# Patient Record
Sex: Male | Born: 1945
Health system: Southern US, Community
[De-identification: ages and names within clinical notes are randomized; demographics above are authoritative.]

## PROBLEM LIST (undated history)

## (undated) DIAGNOSIS — I10 Essential (primary) hypertension: Secondary | ICD-10-CM

## (undated) DIAGNOSIS — K219 Gastro-esophageal reflux disease without esophagitis: Secondary | ICD-10-CM

---
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
## 1998-03-27 HISTORY — PX: PROSTATE BIOPSY: SHX241

## 2011-01-22 LAB — HM HEPATITIS C SCREENING LAB: HM HEPATITIS C SCREENING: NEGATIVE

## 2011-10-21 DIAGNOSIS — R972 Elevated prostate specific antigen [PSA]: Secondary | ICD-10-CM | POA: Insufficient documentation

## 2014-08-21 DIAGNOSIS — N400 Enlarged prostate without lower urinary tract symptoms: Secondary | ICD-10-CM | POA: Insufficient documentation

## 2014-08-21 DIAGNOSIS — N529 Male erectile dysfunction, unspecified: Secondary | ICD-10-CM | POA: Insufficient documentation

## 2014-08-21 DIAGNOSIS — R946 Abnormal results of thyroid function studies: Secondary | ICD-10-CM | POA: Insufficient documentation

## 2014-08-21 DIAGNOSIS — Z205 Contact with and (suspected) exposure to viral hepatitis: Secondary | ICD-10-CM | POA: Insufficient documentation

## 2014-08-21 DIAGNOSIS — I10 Essential (primary) hypertension: Secondary | ICD-10-CM | POA: Insufficient documentation

## 2014-08-21 DIAGNOSIS — G479 Sleep disorder, unspecified: Secondary | ICD-10-CM | POA: Insufficient documentation

## 2014-08-21 DIAGNOSIS — F419 Anxiety disorder, unspecified: Secondary | ICD-10-CM | POA: Insufficient documentation

## 2014-08-21 DIAGNOSIS — E7800 Pure hypercholesterolemia, unspecified: Secondary | ICD-10-CM | POA: Insufficient documentation

## 2014-08-21 DIAGNOSIS — G4733 Obstructive sleep apnea (adult) (pediatric): Secondary | ICD-10-CM | POA: Insufficient documentation

## 2014-11-16 ENCOUNTER — Ambulatory Visit (INDEPENDENT_AMBULATORY_CARE_PROVIDER_SITE_OTHER): Payer: PPO | Admitting: Family Medicine

## 2014-11-16 ENCOUNTER — Encounter: Payer: Self-pay | Admitting: Family Medicine

## 2014-11-16 VITALS — BP 130/60 | HR 60 | Temp 97.8°F | Resp 16 | Ht 68.0 in | Wt 171.2 lb

## 2014-11-16 DIAGNOSIS — Z23 Encounter for immunization: Secondary | ICD-10-CM

## 2014-11-16 DIAGNOSIS — I10 Essential (primary) hypertension: Secondary | ICD-10-CM | POA: Diagnosis not present

## 2014-11-16 DIAGNOSIS — E7800 Pure hypercholesterolemia, unspecified: Secondary | ICD-10-CM

## 2014-11-16 DIAGNOSIS — M545 Low back pain, unspecified: Secondary | ICD-10-CM | POA: Insufficient documentation

## 2014-11-16 DIAGNOSIS — F419 Anxiety disorder, unspecified: Secondary | ICD-10-CM

## 2014-11-16 MED ORDER — FLURBIPROFEN 100 MG PO TABS: 100.0000 mg | ORAL_TABLET | Freq: Two times a day (BID) | ORAL | Status: DC

## 2014-11-16 NOTE — Progress Notes (Signed)
Patient: Michael [name] Male    DOB: October 20, 1945   69 y.o.   MRN: [id number] Visit Date: 11/16/2014  Today's Provider: Vernie Murders, PA   Chief Complaint  Patient presents with  . Hyperlipidemia  . Anxiety  . Hypertension   Subjective:    Anxiety Presents for follow-up visit. Onset was 6 to 12 months ago. The problem has been resolved. Patient reports no chest pain, depressed mood, excessive worry, insomnia, irritability, nervous/anxious behavior, palpitations, panic, shortness of breath or suicidal ideas. Nothing aggravates the symptoms. The quality of sleep is good. Nighttime awakenings: one to two.    Hyperlipidemia The current episode started more than 1 year ago. The problem is controlled. Factors aggravating his hyperlipidemia include fatty foods. Pertinent negatives include no chest pain, leg pain or shortness of breath. The current treatment provides moderate improvement of lipids.  Hypertension This is a chronic problem. The current episode started more than 1 year ago. Associated symptoms include anxiety. Pertinent negatives include no blurred vision, chest pain, headaches, palpitations, peripheral edema or shortness of breath. Compliance problems include diet and exercise.   Patient has been taking his blood pressures 140/71 10/21/14, 138/78 08/29, 141/76 08/31 today blood pressure at 3:00 am was 134/70.    Patient Active Problem List   Diagnosis Date Noted  . Low back ache 11/16/2014  . Anxiety 08/21/2014  . Abnormal finding on thyroid function test 08/21/2014  . Benign fibroma of prostate 08/21/2014  . ED (erectile dysfunction) of organic origin 08/21/2014  . Exposure to viral hepatitis 08/21/2014  . Hypercholesteremia 08/21/2014  . Benign hypertension 08/21/2014  . Severe obstructive sleep apnea 08/21/2014  . Sleep disturbance 08/21/2014  . Abnormal prostate specific antigen 10/21/2011  . Benign prostatic hyperplasia with urinary obstruction 10/21/2011     Past Surgical History  Procedure Laterality Date  . Prostate biopsy  03/1998    NEGATIVE  . Amputation finger Left 1965    5TH DIGIT, INDUSTRIAL ACCIDENT   Family History  Problem Relation Age of Onset  . Emphysema Mother   . Diabetes Maternal Uncle     Allergies  Allergen Reactions  . Penicillins   . Sulfa Antibiotics   . Sulfamethoxazole-Trimethoprim    Previous Medications   ASPIRIN 81 MG TABLET    Take 1 tablet by mouth daily.   ATORVASTATIN (LIPITOR) 40 MG TABLET    Take 1 tablet by mouth at bedtime.   DUTASTERIDE (AVODART) 0.5 MG CAPSULE    Take 1 capsule by mouth 3 (three) times a week.   FLURBIPROFEN (ANSAID) 100 MG TABLET    Take 1 tablet by mouth 2 (two) times daily.   LOSARTAN-HYDROCHLOROTHIAZIDE (HYZAAR) 50-12.5 MG PER TABLET    Take 1 tablet by mouth daily.   MELATONIN (RA MELATONIN) 10 MG TABS    Take by mouth.   OMEGA-3 1000 MG CAPS    Take by mouth.   SILDENAFIL (REVATIO) 20 MG TABLET    Take 1 tablet by mouth daily.   TRAZODONE (DESYREL) 100 MG TABLET    Take 1 tablet by mouth daily.    Review of Systems  Constitutional: Negative.  Negative for irritability.  HENT: Negative.   Eyes: Negative.  Negative for blurred vision.  Respiratory: Negative.  Negative for shortness of breath.   Cardiovascular: Negative.  Negative for chest pain and palpitations.  Gastrointestinal: Negative.   Endocrine: Negative.   Genitourinary: Negative.   Musculoskeletal: Negative.   Skin: Negative.   Allergic/Immunologic:  Negative.   Neurological: Negative.  Negative for headaches.  Hematological: Negative.   Psychiatric/Behavioral: Negative.  Negative for suicidal ideas. The patient is not nervous/anxious and does not have insomnia.     Social History  Substance Use Topics  . Smoking status: Never Smoker   . Smokeless tobacco: Not on file  . Alcohol Use: No   Objective:   BP 130/60 mmHg  Pulse 60  Temp(Src) 97.8 F (36.6 C) (Oral)  Resp 16  Wt 171 lb 3.2 oz  (77.656 kg)  SpO2 96% Body mass index is 27.65 kg/(m^2).  Physical Exam  Constitutional: He is oriented to person, place, and time. He appears well-developed and well-nourished. No distress.  HENT:  Head: Normocephalic and atraumatic.  Right Ear: Hearing normal.  Left Ear: Hearing normal.  Nose: Nose normal.  Mouth/Throat: Oropharynx is clear and moist.  Eyes: Conjunctivae, EOM and lids are normal. Right eye exhibits no discharge. Left eye exhibits no discharge. No scleral icterus.  Neck: Neck supple.  Cardiovascular: Normal rate and regular rhythm.   Pulmonary/Chest: Effort normal and breath sounds normal. No respiratory distress.  Abdominal: Bowel sounds are normal.  Musculoskeletal: Normal range of motion.  Neurological: He is alert and oriented to person, place, and time.  Skin: Skin is intact. No lesion and no rash noted.  Psychiatric: He has a normal mood and affect. His speech is normal and behavior is normal. Thought content normal.      Assessment & Plan:     1. Benign hypertension Well controlled. Tolerating Hyzaar and low fat diet. Recheck routine labs and return to the clinic prn. - CBC with Differential/Platelet  2. Anxiety Tolerating Trazodone 100 mg qd and adding Melatonin 10 mg q hs prn. Refill Trazodone and recheck prn.  3. Hypercholesteremia Stable. Tolerating Atorvastatin and Omega- 3 Fish oil.  Continue present dosage and recheck blood chemistry with lipid panel. Follow up pending reports.  - Comprehensive metabolic panel - Lipid panel  4. Midline low back pain without sciatica Intermittent aching discomfort. May continue Ansaid 100 mg BID prn flare. Good ROM today. May apply moist heat prn.  5. Needs flu shot - Flu vaccine HIGH DOSE PF      Vernie Murders, PA  [facility]

## 2014-11-17 ENCOUNTER — Telehealth: Payer: Self-pay | Admitting: Family Medicine

## 2014-11-17 LAB — COMPREHENSIVE METABOLIC PANEL
A/G RATIO: 2 (ref 1.1–2.5)
ALK PHOS: 63 IU/L (ref 39–117)
ALT: 17 IU/L (ref 0–44)
AST: 20 IU/L (ref 0–40)
Albumin: 4.5 g/dL (ref 3.6–4.8)
BUN/Creatinine Ratio: 17 (ref 10–22)
BUN: 19 mg/dL (ref 8–27)
Bilirubin Total: 0.6 mg/dL (ref 0.0–1.2)
CO2: 28 mmol/L (ref 18–29)
Calcium: 9.8 mg/dL (ref 8.6–10.2)
Chloride: 98 mmol/L (ref 97–108)
Creatinine, Ser: 1.1 mg/dL (ref 0.76–1.27)
GFR calc Af Amer: 80 mL/min/{1.73_m2} (ref >59)
GFR, EST NON AFRICAN AMERICAN: 70 mL/min/{1.73_m2} (ref >59)
GLOBULIN, TOTAL: 2.3 g/dL (ref 1.5–4.5)
Glucose: 107 mg/dL — ABNORMAL HIGH (ref 65–99)
POTASSIUM: 4.3 mmol/L (ref 3.5–5.2)
SODIUM: 140 mmol/L (ref 134–144)
Total Protein: 6.8 g/dL (ref 6.0–8.5)

## 2014-11-17 LAB — CBC WITH DIFFERENTIAL/PLATELET
BASOS: 0 %
Basophils Absolute: 0 10*3/uL (ref 0.0–0.2)
EOS (ABSOLUTE): 0.2 10*3/uL (ref 0.0–0.4)
EOS: 3 %
HEMATOCRIT: 49.1 % (ref 37.5–51.0)
Hemoglobin: 16.8 g/dL (ref 12.6–17.7)
Immature Grans (Abs): 0 10*3/uL (ref 0.0–0.1)
Immature Granulocytes: 0 %
LYMPHS ABS: 2.2 10*3/uL (ref 0.7–3.1)
Lymphs: 36 %
MCH: 31.8 pg (ref 26.6–33.0)
MCHC: 34.2 g/dL (ref 31.5–35.7)
MCV: 93 fL (ref 79–97)
MONOS ABS: 0.6 10*3/uL (ref 0.1–0.9)
Monocytes: 10 %
Neutrophils Absolute: 3.1 10*3/uL (ref 1.4–7.0)
Neutrophils: 51 %
Platelets: 244 10*3/uL (ref 150–379)
RBC: 5.28 x10E6/uL (ref 4.14–5.80)
RDW: 12.7 % (ref 12.3–15.4)
WBC: 6.1 10*3/uL (ref 3.4–10.8)

## 2014-11-17 LAB — LIPID PANEL
CHOL/HDL RATIO: 3.9 ratio (ref 0.0–5.0)
CHOLESTEROL TOTAL: 160 mg/dL (ref 100–199)
HDL: 41 mg/dL (ref >39)
LDL Calculated: 106 mg/dL — ABNORMAL HIGH (ref 0–99)
TRIGLYCERIDES: 65 mg/dL (ref 0–149)
VLDL Cholesterol Cal: 13 mg/dL (ref 5–40)

## 2014-11-17 MED ORDER — TRAZODONE HCL 100 MG PO TABS: 100.0000 mg | ORAL_TABLET | Freq: Every day | ORAL | Status: DC

## 2014-11-17 NOTE — Telephone Encounter (Signed)
Pt contacted office for refill request on the following medications:  traZODone (DESYREL) 100 MG.  [facility] order.  (Pt states he changed insurance so a new Rx needs to be sent)  [phone]

## 2014-11-20 ENCOUNTER — Telehealth: Payer: Self-pay | Admitting: Family Medicine

## 2014-11-20 NOTE — Telephone Encounter (Signed)
Pharmacy called needing clarification on his prescription flurbiprofen (ANSAID) 100 MG tablet  Take 1 tablet (100 mg total) by mouth 2 (two) times daily. As needed for back pain  She has a question on quantity?  Please call her back at [phone]  Thanks Con Memos

## 2014-11-20 NOTE — Telephone Encounter (Signed)
Attempted to call Taryn back with [facility] mail order pharmacy. No answer.

## 2014-11-21 NOTE — Telephone Encounter (Signed)
-----   Message from Margo Common, [place] sent at 11/17/2014  5:20 PM EDT ----- Blood tests in good shape. Continue present medications and recheck in 4-6 months.

## 2014-11-21 NOTE — Telephone Encounter (Signed)
Pharmacy had a question about the quantity of refill for flurbiprofen #90?

## 2014-11-21 NOTE — Telephone Encounter (Signed)
Patient advised as directed below. Patient verbalized understanding and agrees with treatment plan. 

## 2014-12-04 ENCOUNTER — Encounter: Payer: Self-pay | Admitting: Family Medicine

## 2014-12-04 ENCOUNTER — Ambulatory Visit (INDEPENDENT_AMBULATORY_CARE_PROVIDER_SITE_OTHER): Payer: PPO | Admitting: Family Medicine

## 2014-12-04 VITALS — BP 155/80 | HR 72 | Temp 97.9°F | Resp 18

## 2014-12-04 DIAGNOSIS — G43A0 Cyclical vomiting, not intractable: Secondary | ICD-10-CM

## 2014-12-04 DIAGNOSIS — R42 Dizziness and giddiness: Secondary | ICD-10-CM | POA: Diagnosis not present

## 2014-12-04 MED ORDER — DIAZEPAM 5 MG PO TABS
5.0000 mg | ORAL_TABLET | Freq: Three times a day (TID) | ORAL | Status: DC | PRN
Start: 2014-12-04 — End: 2015-03-26

## 2014-12-04 MED ORDER — PROMETHAZINE HCL 25 MG/ML IJ SOLN
50.0000 mg | Freq: Once | INTRAMUSCULAR | Status: AC
Start: 2014-12-04 — End: 2014-12-04
  Administered 2014-12-04: 50 mg via INTRAMUSCULAR

## 2014-12-04 NOTE — Progress Notes (Signed)
Patient: Michael [name] Male    DOB: 1945-06-01   69 y.o.   MRN: [id number] Visit Date: 12/04/2014  Today's Provider: Vernie Murders, PA   Chief Complaint  Patient presents with  . Emesis  . Dizziness   Subjective:    Emesis  This is a new problem. The current episode started today. The problem has been unchanged. There has been no fever. Associated symptoms include dizziness and sweats. Pertinent negatives include no fever or headaches. He has tried nothing for the symptoms.  Dizziness This is a new problem. The current episode started today. The problem occurs constantly. The problem has been unchanged. Associated symptoms include nausea, vertigo, vomiting and weakness. Pertinent negatives include no fever or headaches. The symptoms are aggravated by bending, standing and walking. He has tried nothing for the symptoms.  Symptoms started this morning while working under a car changing its oil. Could not stand without staggering from severe dizziness, nausea and vomiting. Has a history of similar episode 4 years ago that lasted 3-4 days. Was treated with Diazepam at that time.  Past Surgical History  Procedure Laterality Date  . Prostate biopsy  03/1998    NEGATIVE  . Amputation finger Left 1965    5TH DIGIT, INDUSTRIAL ACCIDENT   Family History  Problem Relation Age of Onset  . Emphysema Mother   . Diabetes Maternal Uncle     Allergies  Allergen Reactions  . Penicillins   . Sulfa Antibiotics   . Sulfamethoxazole-Trimethoprim    Previous Medications   ASPIRIN 81 MG TABLET    Take 1 tablet by mouth daily.   ATORVASTATIN (LIPITOR) 40 MG TABLET    Take 1 tablet by mouth at bedtime.   DUTASTERIDE (AVODART) 0.5 MG CAPSULE    Take 1 capsule by mouth 3 (three) times a week.   FLURBIPROFEN (ANSAID) 100 MG TABLET    Take 1 tablet (100 mg total) by mouth 2 (two) times daily. As needed for back pain.   LOSARTAN-HYDROCHLOROTHIAZIDE (HYZAAR) 50-12.5 MG PER TABLET    Take 1  tablet by mouth daily.   MELATONIN (RA MELATONIN) 10 MG TABS    Take by mouth.   OMEGA-3 1000 MG CAPS    Take by mouth.   SILDENAFIL (REVATIO) 20 MG TABLET    Take 1 tablet by mouth daily.   TRAZODONE (DESYREL) 100 MG TABLET    Take 1 tablet (100 mg total) by mouth daily.    Review of Systems  Constitutional: Negative for fever.  Gastrointestinal: Positive for nausea and vomiting.  Neurological: Positive for dizziness, vertigo and weakness. Negative for headaches.    Social History  Substance Use Topics  . Smoking status: Never Smoker   . Smokeless tobacco: Not on file  . Alcohol Use: No   Objective:   BP 155/80 mmHg  Pulse 72  Temp(Src) 97.9 F (36.6 C) (Oral)  Resp 18  Wt   Physical Exam  Constitutional: He is oriented to person, place, and time. He appears well-developed and well-nourished.  HENT:  Head: Normocephalic.  Cardiovascular: Normal rate and regular rhythm.   Pulmonary/Chest: Breath sounds normal.  Abdominal: Soft. Bowel sounds are normal.  Neurological: He is alert and oriented to person, place, and time.  Extreme vertigo with nausea and vomiting. Unable to move head or open eyes without increase in vertigo. Can't stand or walk. No muscle weakness or unilateral weakness.      Assessment & Plan:  1. Vertigo Severe central vertigo. Unable to stand and having severe returning nausea with vomiting (vomited about 3 times today). Symptoms started few minutes ago while working under a car to change the oil. Similar severe episode of true vertigo 4 years ago. Will give injection for nausea and vomiting. Add Diazepam for vertigo and go home to rest. Must not drive and be sure he gets some fluids. If no better in 2 days, should recheck or go to ER if worsening. - diazepam (VALIUM) 5 MG tablet; Take 1 tablet (5 mg total) by mouth every 8 (eight) hours as needed for anxiety.  Dispense: 12 tablet; Refill: 0 - promethazine (PHENERGAN) injection 50 mg; Inject 2 mLs (50 mg  total) into the muscle once.  2. Cyclical vomiting with nausea, intractability of vomiting not specified Secondary to severe vertigo. No sweats or diaphoresis. No fever, stomach pains, or weakness. Recheck if no better in 2-3 days and if worsening (or getting dehydrated) should go to the ER. May need IV hydration. Recheck prn. - promethazine (PHENERGAN) injection 50 mg; Inject 2 mLs (50 mg total) into the muscle once.       Vernie Murders, PA  [facility]

## 2014-12-04 NOTE — Patient Instructions (Signed)

## 2014-12-22 ENCOUNTER — Encounter: Payer: Self-pay | Admitting: Family Medicine

## 2014-12-22 ENCOUNTER — Ambulatory Visit (INDEPENDENT_AMBULATORY_CARE_PROVIDER_SITE_OTHER): Payer: PPO | Admitting: Family Medicine

## 2014-12-22 VITALS — BP 128/68 | HR 70 | Temp 97.7°F | Resp 16 | Wt 172.8 lb

## 2014-12-22 DIAGNOSIS — R42 Dizziness and giddiness: Secondary | ICD-10-CM | POA: Diagnosis not present

## 2014-12-22 NOTE — Patient Instructions (Signed)
Epley Maneuver Self-Care  WHAT IS THE EPLEY MANEUVER?  The Epley maneuver is an exercise you can do to relieve symptoms of benign paroxysmal positional vertigo (BPPV). This condition is often just referred to as vertigo. BPPV is caused by the movement of tiny crystals (canaliths) inside your inner ear. The accumulation and movement of canaliths in your inner ear causes a sudden spinning sensation (vertigo) when you move your head to certain positions. Vertigo usually lasts about 30 seconds. BPPV usually occurs in just one ear. If you get vertigo when you lie on your left side, you probably have BPPV in your left ear. Your health care provider can tell you which ear is involved.   BPPV may be caused by a head injury. Many people older than 50 get BPPV for unknown reasons. If you have been diagnosed with BPPV, your health care provider may teach you how to do this maneuver. BPPV is not life threatening (benign) and usually goes away in time.   WHEN SHOULD I PERFORM THE EPLEY MANEUVER?  You can do this maneuver at home whenever you have symptoms of vertigo. You may do the Epley maneuver up to 3 times a day until your symptoms of vertigo go away.  HOW SHOULD I DO THE EPLEY MANEUVER?  1. Sit on the edge of a bed or table with your back straight. Your legs should be extended or hanging over the edge of the bed or table.    2. Turn your head halfway toward the affected ear.    3. Lie backward quickly with your head turned until you are lying flat on your back. You may want to position a pillow under your shoulders.    4. Hold this position for 30 seconds. You may experience an attack of vertigo. This is normal. Hold this position until the vertigo stops.  5. Then turn your head to the opposite direction until your unaffected ear is facing the floor.    6. Hold this position for 30 seconds. You may experience an attack of vertigo. This is normal. Hold this position until the vertigo stops.  7. Now turn your whole body to  the same side as your head. Hold for another 30 seconds.    8. You can then sit back up.  ARE THERE RISKS TO THIS MANEUVER?  In some cases, you may have other symptoms (such as changes in your vision, weakness, or numbness). If you have these symptoms, stop doing the maneuver and call your health care provider. Even if doing these maneuvers relieves your vertigo, you may still have dizziness. Dizziness is the sensation of light-headedness but without the sensation of movement. Even though the Epley maneuver may relieve your vertigo, it is possible that your symptoms will return within 5 years.  WHAT SHOULD I DO AFTER THIS MANEUVER?  After doing the Epley maneuver, you can return to your normal activities. Ask your doctor if there is anything you should do at home to prevent vertigo. This may include:  · Sleeping with two or more pillows to keep your head elevated.  · Not sleeping on the side of your affected ear.  · Getting up slowly from bed.  · Avoiding sudden movements during the day.  · Avoiding extreme head movement, like looking up or bending over.  · Wearing a cervical collar to prevent sudden head movements.  WHAT SHOULD I DO IF MY SYMPTOMS GET WORSE?  Call your health care provider if your vertigo gets worse. Call your provider right way if   to replace advice given to you by your health care provider. Make sure you discuss any questions you have with your health care provider.   Document Released: 02/15/2013 Document Reviewed: 02/15/2013 Elsevier Interactive Patient Education [employer]. Vertigo Vertigo means you feel like you or your surroundings are moving when they are not. Vertigo can be dangerous if it occurs when you are at work, driving, or performing difficult activities.  CAUSES  Vertigo occurs when there is a conflict of signals  sent to your brain from the visual and sensory systems in your body. There are many different causes of vertigo, including: 9. Infections, especially in the inner ear. 10. A bad reaction to a drug or misuse of alcohol and medicines. 11. Withdrawal from drugs or alcohol. 12. Rapidly changing positions, such as lying down or rolling over in bed. 13. A migraine headache. 14. Decreased blood flow to the brain. 15. Increased pressure in the brain from a head injury, infection, tumor, or bleeding. SYMPTOMS  You may feel as though the world is spinning around or you are falling to the ground. Because your balance is upset, vertigo can cause nausea and vomiting. You may have involuntary eye movements (nystagmus). DIAGNOSIS  Vertigo is usually diagnosed by physical exam. If the cause of your vertigo is unknown, your caregiver may perform imaging tests, such as an MRI scan (magnetic resonance imaging). TREATMENT  Most cases of vertigo resolve on their own, without treatment. Depending on the cause, your caregiver may prescribe certain medicines. If your vertigo is related to body position issues, your caregiver may recommend movements or procedures to correct the problem. In rare cases, if your vertigo is caused by certain inner ear problems, you may need surgery. HOME CARE INSTRUCTIONS   Follow your caregiver's instructions.  Avoid driving.  Avoid operating heavy machinery.  Avoid performing any tasks that would be dangerous to you or others during a vertigo episode.  Tell your caregiver if you notice that certain medicines seem to be causing your vertigo. Some of the medicines used to treat vertigo episodes can actually make them worse in some people. SEEK IMMEDIATE MEDICAL CARE IF:   Your medicines do not relieve your vertigo or are making it worse.  You develop problems with talking, walking, weakness, or using your arms, hands, or legs.  You develop severe headaches.  Your nausea or  vomiting continues or gets worse.  You develop visual changes.  A family member notices behavioral changes.  Your condition gets worse. MAKE SURE YOU:  Understand these instructions.  Will watch your condition.  Will get help right away if you are not doing well or get worse.   This information is not intended to replace advice given to you by your health care provider. Make sure you discuss any questions you have with your health care provider.   Document Released: 11/20/2004 Document Revised: 05/05/2011 Document Reviewed: 06/05/2014 Elsevier Interactive Patient Education [employer].

## 2014-12-22 NOTE — Progress Notes (Signed)
Patient ID: HOA DERISO, male   DOB: [birth year], 69 y.o.   MRN: [id number] Name: Michael [name]   MRN: [id number]    DOB: [birth year]   Date:12/22/2014       Progress Note  Subjective  Chief Complaint  Chief Complaint  Patient presents with  . Dizziness  . Tinnitus    Dizziness This is a new problem. The problem occurs intermittently. Associated symptoms include vertigo.  Began to have some sensation of dizziness yesterday and took one of the Diazepam. Felt better after lying down to rest. Has had two severe bouts in the past 5 years. Last episode on 12-04-14 was finally relieved by the Diazepam with anti-emetic. Still has some feeling of light headed sensation.  Patient Active Problem List   Diagnosis Date Noted  . Low back ache 11/16/2014  . Anxiety 08/21/2014  . Abnormal finding on thyroid function test 08/21/2014  . Benign fibroma of prostate 08/21/2014  . ED (erectile dysfunction) of organic origin 08/21/2014  . Exposure to viral hepatitis 08/21/2014  . Hypercholesteremia 08/21/2014  . Benign hypertension 08/21/2014  . Severe obstructive sleep apnea 08/21/2014  . Sleep disturbance 08/21/2014  . Abnormal prostate specific antigen 10/21/2011  . Benign prostatic hyperplasia with urinary obstruction 10/21/2011  . Elevated prostate specific antigen (PSA) 10/21/2011   Social History  Substance Use Topics  . Smoking status: Never Smoker   . Smokeless tobacco: Not on file  . Alcohol Use: No    Current outpatient prescriptions:  .  aspirin 81 MG tablet, Take 1 tablet by mouth daily., Disp: , Rfl:  .  atorvastatin (LIPITOR) 40 MG tablet, Take 1 tablet by mouth at bedtime., Disp: , Rfl:  .  diazepam (VALIUM) 5 MG tablet, Take 1 tablet (5 mg total) by mouth every 8 (eight) hours as needed for anxiety., Disp: 12 tablet, Rfl: 0 .  dutasteride (AVODART) 0.5 MG capsule, Take 1 capsule by mouth 3 (three) times a week., Disp: , Rfl:  .  flurbiprofen (ANSAID) 100 MG tablet, Take 1  tablet (100 mg total) by mouth 2 (two) times daily. As needed for back pain., Disp: 90 tablet, Rfl: 1 .  losartan-hydrochlorothiazide (HYZAAR) 50-12.5 MG per tablet, Take 1 tablet by mouth daily., Disp: , Rfl:  .  Melatonin (RA MELATONIN) 10 MG TABS, Take by mouth., Disp: , Rfl:  .  Omega-3 1000 MG CAPS, Take by mouth., Disp: , Rfl:  .  sildenafil (REVATIO) 20 MG tablet, Take 1 tablet by mouth daily., Disp: , Rfl:  .  traZODone (DESYREL) 100 MG tablet, Take 1 tablet (100 mg total) by mouth daily., Disp: 90 tablet, Rfl: 3  Allergies  Allergen Reactions  . Penicillins   . Sulfa Antibiotics   . Sulfamethoxazole-Trimethoprim     Review of Systems  Constitutional: Negative.   HENT: Positive for tinnitus.   Eyes: Negative.   Respiratory: Negative.   Cardiovascular: Negative.   Gastrointestinal: Negative.   Genitourinary: Negative.   Musculoskeletal: Negative.   Skin: Negative.   Neurological: Positive for dizziness and vertigo.  Endo/Heme/Allergies: Negative.   Psychiatric/Behavioral: Negative.    Objective  Filed Vitals:   12/22/14 0831  BP: 128/68  Pulse: 70  Temp: 97.7 F (36.5 C)  TempSrc: Oral  Resp: 16  Weight: 172 lb 12.8 oz (78.382 kg)  SpO2: 98%   Physical Exam  Constitutional: He is oriented to person, place, and time and well-developed, well-nourished, and in no distress.  HENT:  Head: Normocephalic.  Right Ear:  External ear normal.  Left Ear: External ear normal.  Nose: Nose normal.  Mouth/Throat: Oropharynx is clear and moist.  Eyes: Conjunctivae and EOM are normal.  Neck: Normal range of motion. Neck supple.  Cardiovascular: Normal rate, regular rhythm and normal heart sounds.   Pulmonary/Chest: Effort normal and breath sounds normal.  Abdominal: Soft. Bowel sounds are normal.  Musculoskeletal: Normal range of motion.  Neurological: He is alert and oriented to person, place, and time. Gait normal.  Psychiatric: Memory, affect and judgment normal.    Recent Results (from the past 2160 hour(s))  CBC with Differential/Platelet     Status: None   Collection Time: 11/16/14  9:35 AM  Result Value Ref Range   WBC 6.1 3.4 - 10.8 x10E3/uL   RBC 5.28 4.14 - 5.80 x10E6/uL   Hemoglobin 16.8 12.6 - 17.7 g/dL   Hematocrit 49.1 37.5 - 51.0 %   MCV 93 79 - 97 fL   MCH 31.8 26.6 - 33.0 pg   MCHC 34.2 31.5 - 35.7 g/dL   RDW 12.7 12.3 - 15.4 %   Platelets 244 150 - 379 x10E3/uL   Neutrophils 51 %   Lymphs 36 %   Monocytes 10 %   Eos 3 %   Basos 0 %   Neutrophils Absolute 3.1 1.4 - 7.0 x10E3/uL   Lymphocytes Absolute 2.2 0.7 - 3.1 x10E3/uL   Monocytes Absolute 0.6 0.1 - 0.9 x10E3/uL   EOS (ABSOLUTE) 0.2 0.0 - 0.4 x10E3/uL   Basophils Absolute 0.0 0.0 - 0.2 x10E3/uL   Immature Granulocytes 0 %   Immature Grans (Abs) 0.0 0.0 - 0.1 x10E3/uL  Comprehensive metabolic panel     Status: Abnormal   Collection Time: 11/16/14  9:35 AM  Result Value Ref Range   Glucose 107 (H) 65 - 99 mg/dL   BUN 19 8 - 27 mg/dL   Creatinine, Ser 1.10 0.76 - 1.27 mg/dL   GFR calc non Af Amer 70 >59 mL/min/1.73   GFR calc Af Amer 80 >59 mL/min/1.73   BUN/Creatinine Ratio 17 10 - 22   Sodium 140 134 - 144 mmol/L   Potassium 4.3 3.5 - 5.2 mmol/L   Chloride 98 97 - 108 mmol/L   CO2 28 18 - 29 mmol/L   Calcium 9.8 8.6 - 10.2 mg/dL   Total Protein 6.8 6.0 - 8.5 g/dL   Albumin 4.5 3.6 - 4.8 g/dL   Globulin, Total 2.3 1.5 - 4.5 g/dL   Albumin/Globulin Ratio 2.0 1.1 - 2.5   Bilirubin Total 0.6 0.0 - 1.2 mg/dL   Alkaline Phosphatase 63 39 - 117 IU/L   AST 20 0 - 40 IU/L   ALT 17 0 - 44 IU/L  Lipid panel     Status: Abnormal   Collection Time: 11/16/14  9:35 AM  Result Value Ref Range   Cholesterol, Total 160 100 - 199 mg/dL   Triglycerides 65 0 - 149 mg/dL   HDL 41 >39 mg/dL    Comment: According to ATP-III Guidelines, HDL-C >59 mg/dL is considered a negative risk factor for CHD.    VLDL Cholesterol Cal 13 5 - 40 mg/dL   LDL Calculated 106 (H) 0 - 99 mg/dL    Chol/HDL Ratio 3.9 0.0 - 5.0 ratio units    Comment:                                   T. Chol/HDL Ratio  Men  Women                               1/2 Avg.Risk  3.4    3.3                                   Avg.Risk  5.0    4.4                                2X Avg.Risk  9.6    7.1                                3X Avg.Risk 23.4   11.0     Assessment & Plan  1. Vertigo Recent recurrence of true vertigo. Recommend referral to physical therapy for Epley Maneuver teaching. May use Meclizine for nausea and dizziness. If no improved, may use Diazepam. If persistent, may need referral to neurologist. - Ambulatory referral to Physical Therapy

## 2015-01-02 ENCOUNTER — Encounter: Payer: Self-pay | Admitting: Physical Therapy

## 2015-01-02 ENCOUNTER — Ambulatory Visit: Payer: PPO | Attending: Family Medicine | Admitting: Physical Therapy

## 2015-01-02 DIAGNOSIS — R29818 Other symptoms and signs involving the nervous system: Secondary | ICD-10-CM | POA: Diagnosis present

## 2015-01-02 DIAGNOSIS — R2689 Other abnormalities of gait and mobility: Secondary | ICD-10-CM

## 2015-01-02 DIAGNOSIS — R42 Dizziness and giddiness: Secondary | ICD-10-CM | POA: Diagnosis present

## 2015-01-02 NOTE — Therapy (Addendum)
[place] MAIN [facility] SERVICES [address] [place], [place], [address] Phone: [phone]   Fax:  [phone]  Physical Therapy Evaluation  Patient Details  Name: Michael [name] MRN: [id number] Date of Birth: 1945/08/25 No Data Recorded  Encounter Date: 01/02/2015      PT End of Session - 01/02/15 1439    Visit Number 1   Number of Visits 8   Date for PT Re-Evaluation 02/27/15   Authorization Type no G codes   PT Start Time 7681   PT Stop Time 1205   PT Time Calculation (min) 72 min   Equipment Utilized During Treatment Gait belt   Activity Tolerance Other (comment)  pt limited at times during session due to nausea and dizziness   Behavior During Therapy [facility] for tasks assessed/performed      History reviewed. No pertinent past medical history.  Past Surgical History  Procedure Laterality Date  . Prostate biopsy  03/1998    NEGATIVE  . 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  Visit Diagnosis:  Dizziness and giddiness  Loss of balance    VESTIBULAR AND BALANCE EVALUATION  Onset Date: 4 weeks ago  HISTORY:  Symptoms at onset: Pt reports his first episode was at least five years ago. Pt reports about 4 weeks ago  That occurred while lying on his back changing oil. Pt reports he feel down from the dizziness. Pt reports his blood pressure was high at that time and it started to come down. Pt states he felt vertigo and started having nausea. Pt states he got a shot at Dr. Alben Spittle office. Pt stated it knocked him out and he felt better after that. Pt reports two weeks ago in the am you felt dizziness coming on so he took Meclizine and rested and it resolved.  Pt reports that movement around him caused increased dizziness.  Pt states he takes meclizine as needed and it helps. Pt reports he gets blurry vision when he gets dizziness. Pt report vertigo symptoms. Pt  reports that he has not had a VNG test.  Description of dizziness: (vertigo, unsteadiness, lightheadedness, falling, general unsteadiness, aural fullness) unsteadiness, denies lightheadedness, vertigo and general unsteadiness. Veering, denies aural fullness. Reports humming sound constantly both ears.  Symptom nature: (motion provoked/positional/spontaneous/constant, variable, intermittent)  Frequency: two times this past month but before that it had been 5 years.  Duration: lasts until he can get medical help. States it can last half an hour or longer.   Provocative Factors: extreme forward bending, looking up, quick head turns and movements.  Easing Factors: Meclizine Progression of symptoms: (better, worse, no change since onset) History of similar episodes: one other time  Falls (yes/no): no  Number of falls in past 6 months: one Subjective history of current problem:  Subjective Prior Functional Level: retired. Pt is independent with ADLs, driving and ambulating in the community without AD. 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 Pt states he runs 2-3 miles every day.   Auditory complaints (tinnitus, pain, drainage): denies pain and drainage Vision: (last eye exam, diplopia, recent changes) denies double vision, denies other changes except does get blurry vision when he is having an episode.   Current Symptoms: ( dysarthria, dysphagia, drop attacks, bowel and bladder changes, recent weight loss/gain, lightheadedness, headache, migraines) Review of systems negative for red  flags. Denies red flags.   EXAMINATION:  POSTURE: fair posture with erect sitting posture.    MUSCULOSKELETAL SCREEN: Cervical Spine ROM: AROM WFL cervical spine flexion/extension and L/R rotation. Pt reports chronic mild arthritis.    Functional Mobility: Independent with transfers and bed mobility. Gait: ambulates without AD with step through gait with good arm  swint Scanning of visual environment with gait is: fair  Balance: Pt demonstrates difficulty with activities on uneven surfaces, narrow BOS and with EC.   POSTURAL CONTROL TESTS:   Clinical Test of Sensory Interaction for Balance    (CTSIB):  CONDITION TIME STRATEGY SWAY  Eyes open, firm surface 30 sec ankle +1  Eyes closed, firm surface 30 sec ankle +2  Eyes open, foam surface 30 sec ankle +1  Eyes closed, foam surface 30 sec Ankle; hip  +2    OCULOMOTOR / VESTIBULAR TESTING:  Oculomotor Exam- Room Light  Normal Abnormal Comments  Ocular Alignment normal    Ocular ROM normal    Spontaneous Nystagmus normal    End-Gaze Nystagmus normal    Smooth Pursuit normal    Saccades normal    VOR  abnormal Pt reports strange sensation   VOR Cancellation normal    Left Head Thrust     Right Head Thrust     Head Shaking Nystagmus  Abnormal  7/10 after 7 reps of head turns and nausea      BPPV TESTS:  Symptoms Duration Intensity Nystagmus  L Dix-Hallpike denies N/A  None observed  R Dix-Hallpike denies N/A  None observed  L Head Roll denies N/A  None observed  R Head Roll denies N/A  None observed    FUNCTIONAL OUTCOME MEASURES:  Results Comments  DHI TBA   ABC Scale TBA   DGI 24/24 Safe for community mobility  10 meter Walking Speed 1.1 m/sec  Within normal range as compared to age and gender normative values.   Neuromuscular Re-education:  VOR exercise: Discussed and demonstrated VOR X 1 exercise. In sitting, pt performed VOR X 1 horiz 1 rep of 30 seconds and then 2 reps of 1 minute each. Pt required cuing for technique. Pt reports dizziness with performing exercise.  Education: Provided education in regards to VOR X 1 exercise via verbal and demonstration as well as handout provided to patient. Pt verbalizes understanding and repeats demonstration of exercise.   G Codes: Clinical judgment and DGI functional outcome measure  Mobility: Walking and Moving Around [id number] CI  at least 1% but <20% impaired, limited or restricted G8979 CI at least 1% but <20% impaired, limited or restricted     PT Long Term Goals - 01/02/15 1504    PT LONG TERM GOAL #1   Title Patient reports no vertigo with provoking motions or positions.   Time 8   Period Weeks   Status New   PT LONG TERM GOAL #2   Title Patient will be able to perform home program independently for self-management.   Time 8   Period Weeks   Status New   PT LONG TERM GOAL #3   Title Pt will be able to perform EC feet together on foam surface for 30 seconds with +1 sway or less to improve balance during functional activities.    Time 8   Period Weeks   Status New               Plan - 01/02/15 1441    Clinical Impression Statement This is a 69 y/o  M that presents with listed functional deficits and complaints of vertigo. Pt with negative L and R Dix-Hallpike and horizontal canal tests this date. Pt with positive VOR X1 and head shaking nystagmus tests. Pt demonstrates difficulty with balance and dizziness symptoms with activites on Airex pad and EC. Pt would benefit from PT servies to address goals as set on POC and to try to decrease pateint's subjective symptoms of dizziness and imbalance/   Pt will benefit from skilled therapeutic intervention in order to improve on the following deficits Decreased balance;Dizziness   Rehab Potential Good   Clinical Impairments Affecting Rehab Potential positive indicators: motivated  negative indicators: age   PT Frequency 1x / week   PT Duration 8 weeks   PT Treatment/Interventions Vestibular;Therapeutic activities;Canalith Repostioning;Patient/family education;Balance training;Neuromuscular re-education   PT Next Visit Plan Review VOR X 1 and progress as able. Test L/R head thrust test as patient was unable to tolerate on initial evaluation and repeat head shaking nystagmus test if tolerated. Consider having patient complete DHI and ABC scale. Consider working on  exercises incorporating quuick movements and horiz head turns.    PT Home Exercise Plan VOR X 1 in sitting 3 reps of 30 seconds   Consulted and Agree with Plan of Care Patient         Problem List Patient Active Problem List   Diagnosis Date Noted  . Low back ache 11/16/2014  . Anxiety 08/21/2014  . Abnormal finding on thyroid function test 08/21/2014  . Benign fibroma of prostate 08/21/2014  . ED (erectile dysfunction) of organic origin 08/21/2014  . Exposure to viral hepatitis 08/21/2014  . Hypercholesteremia 08/21/2014  . Benign hypertension 08/21/2014  . Severe obstructive sleep apnea 08/21/2014  . Sleep disturbance 08/21/2014  . Abnormal prostate specific antigen 10/21/2011  . Benign prostatic hyperplasia with urinary obstruction 10/21/2011  . Elevated prostate specific antigen (PSA) 10/21/2011   Lady Deutscher PT, DPT Lady Deutscher 01/02/2015, 3:22 PM  [place] MAIN [facility] SERVICES [address] [place], [place], [address] Phone: [phone]   Fax:  [phone]  Name: Michael [name] MRN: [id number] Date of Birth: [birth year]

## 2015-01-08 ENCOUNTER — Encounter: Payer: PPO | Admitting: Physical Therapy

## 2015-01-15 ENCOUNTER — Encounter: Payer: PPO | Admitting: Physical Therapy

## 2015-01-23 ENCOUNTER — Encounter: Payer: PPO | Admitting: Physical Therapy

## 2015-02-01 ENCOUNTER — Encounter: Payer: PPO | Admitting: Physical Therapy

## 2015-02-06 ENCOUNTER — Encounter: Payer: PPO | Admitting: Physical Therapy

## 2015-02-13 ENCOUNTER — Encounter: Payer: PPO | Admitting: Physical Therapy

## 2015-02-14 ENCOUNTER — Other Ambulatory Visit: Payer: Self-pay | Admitting: Family Medicine

## 2015-02-15 NOTE — Telephone Encounter (Signed)
Michael [name] Patient.  

## 2015-03-26 ENCOUNTER — Telehealth: Payer: Self-pay | Admitting: Family Medicine

## 2015-03-26 ENCOUNTER — Ambulatory Visit (INDEPENDENT_AMBULATORY_CARE_PROVIDER_SITE_OTHER): Payer: PPO | Admitting: Family Medicine

## 2015-03-26 ENCOUNTER — Encounter: Payer: Self-pay | Admitting: Family Medicine

## 2015-03-26 VITALS — BP 140/78 | HR 64 | Temp 97.6°F | Resp 16 | Wt 178.6 lb

## 2015-03-26 DIAGNOSIS — R42 Dizziness and giddiness: Secondary | ICD-10-CM | POA: Diagnosis not present

## 2015-03-26 MED ORDER — DIAZEPAM 5 MG PO TABS: 5.0000 mg | ORAL_TABLET | Freq: Three times a day (TID) | ORAL | Status: DC | PRN

## 2015-03-26 NOTE — Telephone Encounter (Signed)
Pt stated he made an appt to see Simona Huh today at 8. Pt stated that his nausea is now under control but he is still dizzy. Pt stated that he would like a nurse to call him back to advised if he should still come in for the appt or cancel. Thanks TNP

## 2015-03-26 NOTE — Progress Notes (Signed)
Patient ID: Michael [name], male   DOB: 05-14-1945, 70 y.o.   MRN: [id number]   Patient: Michael [name] Male    DOB: 06-Dec-1945   70 y.o.   MRN: [id number] Visit Date: 03/26/2015  Today's Provider: Vernie Murders, PA   Chief Complaint  Patient presents with  . Dizziness   Subjective:    Dizziness This is a recurrent problem. The current episode started yesterday. The problem occurs intermittently. Associated symptoms include nausea and vertigo.   Patient reports he felt better after taking Diazepam. This episode started last night. Meclizine was added with the Diazepam this morning to control nausea. Has not been able to complete physical therapy for vertigo because wife had her liver transplant 01-07-15 and seems to be doing well.    Patient Active Problem List   Diagnosis Date Noted  . Low back ache 11/16/2014  . Anxiety 08/21/2014  . Abnormal finding on thyroid function test 08/21/2014  . Benign fibroma of prostate 08/21/2014  . ED (erectile dysfunction) of organic origin 08/21/2014  . Exposure to viral hepatitis 08/21/2014  . Hypercholesteremia 08/21/2014  . Benign hypertension 08/21/2014  . Severe obstructive sleep apnea 08/21/2014  . Sleep disturbance 08/21/2014  . Abnormal prostate specific antigen 10/21/2011  . Benign prostatic hyperplasia with urinary obstruction 10/21/2011  . Elevated prostate specific antigen (PSA) 10/21/2011   Past Surgical History  Procedure Laterality Date  . Prostate biopsy  03/1998    NEGATIVE  . Amputation finger Left 1965    5TH DIGIT, INDUSTRIAL ACCIDENT   Family History  Problem Relation Age of Onset  . Emphysema Mother   . Diabetes Maternal Uncle    Previous Medications   ASPIRIN 81 MG TABLET    Take 1 tablet by mouth daily.   ATORVASTATIN (LIPITOR) 40 MG TABLET    Take 1 tablet by mouth at bedtime   DIAZEPAM (VALIUM) 5 MG TABLET    Take 1 tablet (5 mg total) by mouth every 8 (eight) hours as needed for anxiety.   DUTASTERIDE  (AVODART) 0.5 MG CAPSULE    Take 1 capsule by mouth 3 (three) times a week.   FLURBIPROFEN (ANSAID) 100 MG TABLET    Take 1 tablet (100 mg total) by mouth 2 (two) times daily. As needed for back pain.   LOSARTAN-HYDROCHLOROTHIAZIDE (HYZAAR) 50-12.5 MG PER TABLET    Take 1 tablet by mouth daily.   MELATONIN (RA MELATONIN) 10 MG TABS    Take by mouth.   OMEGA-3 1000 MG CAPS    Take by mouth.   SILDENAFIL (REVATIO) 20 MG TABLET    Take 1 tablet by mouth daily.   TRAZODONE (DESYREL) 100 MG TABLET    Take 1 tablet (100 mg total) by mouth daily.   Allergies  Allergen Reactions  . Penicillins   . Sulfa Antibiotics   . Sulfamethoxazole-Trimethoprim     Review of Systems  Constitutional: Negative.   HENT: Negative.   Eyes: Negative.   Respiratory: Negative.   Cardiovascular: Negative.   Gastrointestinal: Positive for nausea.  Endocrine: Negative.   Genitourinary: Negative.   Musculoskeletal: Negative.   Skin: Negative.   Allergic/Immunologic: Negative.   Neurological: Positive for dizziness and vertigo.  Hematological: Negative.   Psychiatric/Behavioral: Negative.     Social History  Substance Use Topics  . Smoking status: Never Smoker   . Smokeless tobacco: Not on file  . Alcohol Use: No   Objective:   BP 140/78 mmHg  Pulse 64  Temp(Src) 97.6 F (  36.4 C) (Oral)  Resp 16  Wt 178 lb 9.6 oz (81.012 kg)  Physical Exam  Constitutional: He is oriented to person, place, and time. He appears well-developed and well-nourished.  HENT:  Head: Normocephalic.  Right Ear: External ear normal.  Left Ear: External ear normal.  Eyes: Conjunctivae and EOM are normal.  Neck: Normal range of motion. Neck supple.  Cardiovascular: Normal rate, regular rhythm and normal heart sounds.   Pulmonary/Chest: Effort normal and breath sounds normal.  Abdominal: Soft. Bowel sounds are normal.  Neurological: He is alert and oriented to person, place, and time.  Slight ataxia to test heel-to-toe  walking and Romberg positive.   Psychiatric: His behavior is normal. Thought content normal. His mood appears anxious. Cognition and memory are normal.      Assessment & Plan:     1. Vertigo Had another episode of dizziness with nausea start yesterday. Took the Diazepam and Meclizine this morning. Feeling better now with some light headed sensation but not as much dizziness. Has not completed physical therapy for the vertigo because wife had her liver transplant and he has been caring for her. Has used 9 Diazepam tablets since October 2016 and requests a refill. Encouraged to proceed with re-establishing physical therapy training for vertigo and refilled the Diazepam. Recheck prn. - diazepam (VALIUM) 5 MG tablet; Take 1 tablet (5 mg total) by mouth every 8 (eight) hours as needed for anxiety.  Dispense: 12 tablet; Refill: 0

## 2015-03-26 NOTE — Telephone Encounter (Signed)
Pt called back to speak with a nurse. I spoke with Michael [name] and informed pt that it was up to him if he wanted to come to the appt or cancel. Pt stated that he would go ahead and come to the appt. Thanks TNP

## 2015-05-17 ENCOUNTER — Ambulatory Visit: Payer: PPO | Admitting: Family Medicine

## 2015-05-18 ENCOUNTER — Encounter: Payer: Self-pay | Admitting: Family Medicine

## 2015-05-18 ENCOUNTER — Ambulatory Visit (INDEPENDENT_AMBULATORY_CARE_PROVIDER_SITE_OTHER): Payer: PPO | Admitting: Family Medicine

## 2015-05-18 VITALS — BP 138/92 | HR 74 | Temp 97.8°F | Resp 16 | Wt 178.6 lb

## 2015-05-18 DIAGNOSIS — I10 Essential (primary) hypertension: Secondary | ICD-10-CM

## 2015-05-18 DIAGNOSIS — E7800 Pure hypercholesterolemia, unspecified: Secondary | ICD-10-CM

## 2015-05-18 NOTE — Progress Notes (Signed)
Patient ID: Michael [name], male   DOB: 16-Sep-1945, 70 y.o.   MRN: [id number]   Patient: Michael [name] Male    DOB: 16-Dec-1945   70 y.o.   MRN: [id number] Visit Date: 05/18/2015  Today's Provider: Vernie Murders, PA   Chief Complaint  Patient presents with  . Hyperlipidemia  . Hypertension  . Follow-up   Subjective:    HPI   Hypertension, follow-up:  BP Readings from Last 3 Encounters:  05/18/15 138/92  03/26/15 140/78  12/22/14 128/68    He was last seen for hypertension 6 months ago.  BP at that visit was 128/68. Management since that visit includes none .He reports good compliance with treatment. He is not having side effects.  He is exercising. He is adherent to low salt diet.   Outside blood pressures are rarely being checked. He is experiencing none.  Patient denies none.   Cardiovascular risk factors include advanced age (older than 62 for men, 101 for women), dyslipidemia and male gender.  Use of agents associated with hypertension: Hyzaar.   ------------------------------------------------------------------------    Lipid/Cholesterol, Follow-up:   Last seen for this 6 months ago.  Management since that visit includes none.  Last Lipid Panel:    Component Value Date/Time   CHOL 160 11/16/2014 0935   TRIG 65 11/16/2014 0935   HDL 41 11/16/2014 0935   CHOLHDL 3.9 11/16/2014 0935   LDLCALC 106* 11/16/2014 0935    He reports good compliance with treatment. He is not having side effects.   Wt Readings from Last 3 Encounters:  05/18/15 178 lb 9.6 oz (81.012 kg)  03/26/15 178 lb 9.6 oz (81.012 kg)  12/22/14 172 lb 12.8 oz (78.382 kg)    ------------------------------------------------------------------------   Patient Active Problem List   Diagnosis Date Noted  . Low back ache 11/16/2014  . Anxiety 08/21/2014  . Abnormal finding on thyroid function test 08/21/2014  . Benign fibroma of prostate 08/21/2014  . ED (erectile dysfunction) of  organic origin 08/21/2014  . Exposure to viral hepatitis 08/21/2014  . Hypercholesteremia 08/21/2014  . Benign hypertension 08/21/2014  . Severe obstructive sleep apnea 08/21/2014  . Sleep disturbance 08/21/2014  . Abnormal prostate specific antigen 10/21/2011  . Benign prostatic hyperplasia with urinary obstruction 10/21/2011  . Elevated prostate specific antigen (PSA) 10/21/2011   Past Surgical History  Procedure Laterality Date  . Prostate biopsy  03/1998    NEGATIVE  . Amputation finger Left 1965    5TH DIGIT, INDUSTRIAL ACCIDENT   Family History  Problem Relation Age of Onset  . Emphysema Mother   . Diabetes Maternal Uncle    Previous Medications   ASPIRIN 81 MG TABLET    Take 1 tablet by mouth daily.   ATORVASTATIN (LIPITOR) 40 MG TABLET    Take 1 tablet by mouth at bedtime   DIAZEPAM (VALIUM) 5 MG TABLET    Take 1 tablet (5 mg total) by mouth every 8 (eight) hours as needed for anxiety.   DUTASTERIDE (AVODART) 0.5 MG CAPSULE    Take 1 capsule by mouth 3 (three) times a week.   FLURBIPROFEN (ANSAID) 100 MG TABLET    Take 1 tablet (100 mg total) by mouth 2 (two) times daily. As needed for back pain.   LOSARTAN-HYDROCHLOROTHIAZIDE (HYZAAR) 50-12.5 MG PER TABLET    Take 1 tablet by mouth daily.   MELATONIN (RA MELATONIN) 10 MG TABS    Take by mouth.   OMEGA-3 1000 MG CAPS    Take by  mouth.   SILDENAFIL (REVATIO) 20 MG TABLET    Take 1 tablet by mouth daily.   TRAZODONE (DESYREL) 100 MG TABLET    Take 1 tablet (100 mg total) by mouth daily.   Allergies  Allergen Reactions  . Penicillins   . Sulfa Antibiotics   . Sulfamethoxazole-Trimethoprim     Review of Systems  Constitutional: Negative.   HENT: Negative.   Eyes: Negative.   Respiratory: Negative.   Cardiovascular: Negative.   Gastrointestinal: Negative.   Endocrine: Negative.   Genitourinary: Negative.   Musculoskeletal: Negative.   Skin: Negative.   Allergic/Immunologic: Negative.   Neurological: Negative.     Hematological: Negative.   Psychiatric/Behavioral: Negative.     Social History  Substance Use Topics  . Smoking status: Never Smoker   . Smokeless tobacco: Not on file  . Alcohol Use: No   Objective:   BP 138/92 mmHg  Pulse 74  Temp(Src) 97.8 F (36.6 C) (Oral)  Resp 16  Wt 178 lb 9.6 oz (81.012 kg)  Temp Readings from Last 3 Encounters:  05/18/15 97.8 F (36.6 C) Oral  03/26/15 97.6 F (36.4 C) Oral  12/22/14 97.7 F (36.5 C) Oral   BP Readings from Last 3 Encounters:  05/18/15 138/92  03/26/15 140/78  12/22/14 128/68   Pulse Readings from Last 3 Encounters:  05/18/15 74  03/26/15 64  12/22/14 70   Physical Exam  Constitutional: He is oriented to person, place, and time. He appears well-developed and well-nourished.  HENT:  Head: Normocephalic.  Right Ear: External ear normal.  Left Ear: External ear normal.  Nose: Nose normal.  Mouth/Throat: Oropharynx is clear and moist.  Eyes: Conjunctivae and EOM are normal.  Neck: Neck supple.  Cardiovascular: Normal rate, regular rhythm and normal heart sounds.   Pulmonary/Chest: Effort normal and breath sounds normal.  Abdominal: Soft. Bowel sounds are normal.  Musculoskeletal: Normal range of motion.  Neurological: He is alert and oriented to person, place, and time.      Assessment & Plan:     1. Benign hypertension Stable and tolerating Hyzaar without side effects. Will recheck labs and follow up pending reports.  - CBC with Differential/Platelet  2. Hypercholesteremia Continues to stay on low fat diet and take Omega-3 with Atorvastatin. No muscle pains. Will check lipids and CMP. Recheck prn.  - Comprehensive metabolic panel - Lipid panel

## 2015-05-19 LAB — CBC WITH DIFFERENTIAL/PLATELET
Basophils Absolute: 0 10*3/uL (ref 0.0–0.2)
Basos: 0 %
EOS (ABSOLUTE): 0.2 10*3/uL (ref 0.0–0.4)
EOS: 4 %
HEMATOCRIT: 47.5 % (ref 37.5–51.0)
HEMOGLOBIN: 16.6 g/dL (ref 12.6–17.7)
IMMATURE GRANS (ABS): 0 10*3/uL (ref 0.0–0.1)
IMMATURE GRANULOCYTES: 0 %
LYMPHS ABS: 1.6 10*3/uL (ref 0.7–3.1)
Lymphs: 27 %
MCH: 31.3 pg (ref 26.6–33.0)
MCHC: 34.9 g/dL (ref 31.5–35.7)
MCV: 90 fL (ref 79–97)
MONOCYTES: 15 %
Monocytes Absolute: 0.9 10*3/uL (ref 0.1–0.9)
Neutrophils Absolute: 3.2 10*3/uL (ref 1.4–7.0)
Neutrophils: 54 %
Platelets: 225 10*3/uL (ref 150–379)
RBC: 5.3 x10E6/uL (ref 4.14–5.80)
RDW: 12.4 % (ref 12.3–15.4)
WBC: 5.9 10*3/uL (ref 3.4–10.8)

## 2015-05-19 LAB — COMPREHENSIVE METABOLIC PANEL
ALBUMIN: 4.5 g/dL (ref 3.6–4.8)
ALT: 24 IU/L (ref 0–44)
AST: 25 IU/L (ref 0–40)
Albumin/Globulin Ratio: 2 (ref 1.2–2.2)
Alkaline Phosphatase: 70 IU/L (ref 39–117)
BUN / CREAT RATIO: 15 (ref 10–22)
BUN: 18 mg/dL (ref 8–27)
Bilirubin Total: 0.6 mg/dL (ref 0.0–1.2)
CALCIUM: 10.2 mg/dL (ref 8.6–10.2)
CO2: 26 mmol/L (ref 18–29)
CREATININE: 1.19 mg/dL (ref 0.76–1.27)
Chloride: 98 mmol/L (ref 96–106)
GFR, EST AFRICAN AMERICAN: 73 mL/min/{1.73_m2} (ref >59)
GFR, EST NON AFRICAN AMERICAN: 63 mL/min/{1.73_m2} (ref >59)
GLOBULIN, TOTAL: 2.3 g/dL (ref 1.5–4.5)
Glucose: 101 mg/dL — ABNORMAL HIGH (ref 65–99)
Potassium: 5.1 mmol/L (ref 3.5–5.2)
SODIUM: 141 mmol/L (ref 134–144)
TOTAL PROTEIN: 6.8 g/dL (ref 6.0–8.5)

## 2015-05-19 LAB — LIPID PANEL
CHOL/HDL RATIO: 4.1 ratio (ref 0.0–5.0)
Cholesterol, Total: 174 mg/dL (ref 100–199)
HDL: 42 mg/dL (ref >39)
LDL CALC: 116 mg/dL — AB (ref 0–99)
Triglycerides: 78 mg/dL (ref 0–149)
VLDL CHOLESTEROL CAL: 16 mg/dL (ref 5–40)

## 2015-05-21 ENCOUNTER — Telehealth: Payer: Self-pay

## 2015-05-21 NOTE — Telephone Encounter (Signed)
-----   Message from Margo Common, [place] sent at 05/21/2015  7:56 AM EDT ----- Blood tests essentially normal. Slight elevation of LDL cholesterol but remainder of lipids in good shape. Continue present medication regimen and recheck in 4-6 months. Increase in activities with warm weather seasons should help maintain control of cholesterol.

## 2015-05-21 NOTE — Telephone Encounter (Signed)
Patient's wife Vickii Chafe (on consent) advised as directed below. Wife verbalized understanding.

## 2015-06-05 ENCOUNTER — Other Ambulatory Visit: Payer: Self-pay | Admitting: Family Medicine

## 2015-07-30 ENCOUNTER — Ambulatory Visit (INDEPENDENT_AMBULATORY_CARE_PROVIDER_SITE_OTHER): Payer: PPO | Admitting: Family Medicine

## 2015-07-30 ENCOUNTER — Encounter: Payer: Self-pay | Admitting: Family Medicine

## 2015-07-30 VITALS — BP 136/70 | HR 72 | Temp 98.0°F | Resp 16 | Wt 177.0 lb

## 2015-07-30 DIAGNOSIS — N401 Enlarged prostate with lower urinary tract symptoms: Secondary | ICD-10-CM | POA: Diagnosis not present

## 2015-07-30 DIAGNOSIS — N138 Other obstructive and reflux uropathy: Secondary | ICD-10-CM

## 2015-07-30 LAB — HEMOCCULT GUIAC POC 1CARD (OFFICE): FECAL OCCULT BLD: NEGATIVE

## 2015-07-30 NOTE — Progress Notes (Signed)
Patient ID: BRIDGE MARZ, [name]   DOB: 01/15/1946, 70 y.o.   MRN: [id number]       Patient: Michael [name]    DOB: Oct 15, 1945   70 y.o.   MRN: [id number] Visit Date: 07/30/2015  Today's Provider: Vernie Murders, PA   Chief Complaint  Patient presents with  . Needs labs done   Subjective:    HPI  Has a history of BPH with LUTS and elevated PSA since 2013. Was followed by Dr. Bernardo Heater (urologist) and was to go back for follow up visit on 07-24-15. Called his office to confirm and found out his insurance no longer considers Dr. Bernardo Heater in the [facility] network. The urologist requested we do his DRE and PSA for follow up. Still has episodes of slow stream.  Patient Active Problem List   Diagnosis Date Noted  . Low back ache 11/16/2014  . Anxiety 08/21/2014  . Abnormal finding on thyroid function test 08/21/2014  . Benign fibroma of prostate 08/21/2014  . ED (erectile dysfunction) of organic origin 08/21/2014  . Exposure to viral hepatitis 08/21/2014  . Hypercholesteremia 08/21/2014  . Benign hypertension 08/21/2014  . Severe obstructive sleep apnea 08/21/2014  . Sleep disturbance 08/21/2014  . Abnormal prostate specific antigen 10/21/2011  . Benign prostatic hyperplasia with urinary obstruction 10/21/2011  . Elevated prostate specific antigen (PSA) 10/21/2011   Past Surgical History  Procedure Laterality Date  . Prostate biopsy  03/1998    NEGATIVE  . Amputation finger Left 1965    5TH DIGIT, INDUSTRIAL ACCIDENT   Family History  Problem Relation Age of Onset  . Emphysema Mother   . Diabetes Maternal Uncle    Allergies  Allergen Reactions  . Penicillins   . Sulfa Antibiotics   . Sulfamethoxazole-Trimethoprim    Previous Medications   ASPIRIN 81 MG TABLET    Take 1 tablet by mouth daily.   ATORVASTATIN (LIPITOR) 40 MG TABLET    Take 1 tablet by mouth at bedtime   DIAZEPAM (VALIUM) 5 MG TABLET    Take 1 tablet (5 mg total) by mouth every 8 (eight) hours as needed for  anxiety.   DUTASTERIDE (AVODART) 0.5 MG CAPSULE    Take 1 capsule by mouth 3 (three) times a week.   FLURBIPROFEN (ANSAID) 100 MG TABLET    Take 1 tablet (100 mg total) by mouth 2 (two) times daily. As needed for back pain.   LOSARTAN-HYDROCHLOROTHIAZIDE (HYZAAR) 50-12.5 MG TABLET    Take 1 tablet by mouth daily   MELATONIN (RA MELATONIN) 10 MG TABS    Take by mouth.   OMEGA-3 1000 MG CAPS    Take by mouth.   SILDENAFIL (REVATIO) 20 MG TABLET    Take 1 tablet by mouth daily.   TRAZODONE (DESYREL) 100 MG TABLET    Take 1 tablet (100 mg total) by mouth daily.    Review of Systems  Constitutional: Negative.   Respiratory: Negative.   Cardiovascular: Negative.   Genitourinary: Negative for urgency.       Intermittent decrease in urine flow without nocturia more than once a night. No hematuria.    Social History  Substance Use Topics  . Smoking status: Never Smoker   . Smokeless tobacco: Not on file  . Alcohol Use: No   Objective:   BP 136/70 mmHg  Pulse 72  Temp(Src) 98 F (36.7 C)  Resp 16  Wt 177 lb (80.287 kg)  Physical Exam  Constitutional: He is oriented to person, place,  and time. He appears well-developed and well-nourished.  HENT:  Head: Normocephalic.  Eyes: Conjunctivae are normal.  Neck: Neck supple.  Cardiovascular: Normal rate and regular rhythm.   Pulmonary/Chest: Breath sounds normal.  Abdominal: Soft. Bowel sounds are normal.  Genitourinary: Rectum normal and prostate normal. Guaiac negative stool.  Neurological: He is alert and oriented to person, place, and time.  Psychiatric: He has a normal mood and affect. His behavior is normal.      Assessment & Plan:     1. Benign prostatic hyperplasia with urinary obstruction Has had benign prostate biopsy in 2000 by Dr. Bernardo Heater. Less nocturia now with use of Avodart. Will recheck PSA. Exam revealed flat prostate without nodules. May need to follow up with urologist pending report. - PSA       Vernie Murders, PA  [facility]

## 2015-07-31 LAB — PSA: PROSTATE SPECIFIC AG, SERUM: 2.9 ng/mL (ref 0.0–4.0)

## 2015-11-19 ENCOUNTER — Other Ambulatory Visit: Payer: Self-pay | Admitting: Family Medicine

## 2015-12-13 ENCOUNTER — Ambulatory Visit (INDEPENDENT_AMBULATORY_CARE_PROVIDER_SITE_OTHER): Payer: PPO

## 2015-12-13 DIAGNOSIS — Z23 Encounter for immunization: Secondary | ICD-10-CM | POA: Diagnosis not present

## 2016-01-30 ENCOUNTER — Telehealth: Payer: Self-pay | Admitting: Family Medicine

## 2016-01-30 NOTE — Telephone Encounter (Signed)
Pt is coming in Friday  They will discuss it then.

## 2016-01-30 NOTE — Telephone Encounter (Signed)
Called Pt to schedule AWV with [facility] - knb °

## 2016-01-31 ENCOUNTER — Ambulatory Visit: Payer: PPO | Admitting: Family Medicine

## 2016-01-31 ENCOUNTER — Other Ambulatory Visit: Payer: Self-pay | Admitting: Family Medicine

## 2016-02-01 ENCOUNTER — Ambulatory Visit (INDEPENDENT_AMBULATORY_CARE_PROVIDER_SITE_OTHER): Payer: PPO | Admitting: Family Medicine

## 2016-02-01 ENCOUNTER — Encounter: Payer: Self-pay | Admitting: Family Medicine

## 2016-02-01 VITALS — BP 134/84 | HR 69 | Temp 98.0°F | Resp 16 | Wt 178.8 lb

## 2016-02-01 DIAGNOSIS — F419 Anxiety disorder, unspecified: Secondary | ICD-10-CM | POA: Diagnosis not present

## 2016-02-01 DIAGNOSIS — E7800 Pure hypercholesterolemia, unspecified: Secondary | ICD-10-CM | POA: Diagnosis not present

## 2016-02-01 DIAGNOSIS — I10 Essential (primary) hypertension: Secondary | ICD-10-CM

## 2016-02-01 NOTE — Progress Notes (Signed)
Patient: Michael [name] Male    DOB: [birth year]   70 y.o.   MRN: [id number] Visit Date: 02/01/2016  Today's Provider: Vernie Murders, PA   Chief Complaint  Patient presents with  . Hyperlipidemia  . Hypertension  . Follow-up   Subjective:    HPI   Hypertension, follow-up:  BP Readings from Last 3 Encounters:  02/01/16 134/84  07/30/15 136/70  05/18/15 (!) 138/92    He was last seen for hypertension 8 months ago.  BP at that visit was 138/92. Management since that visit includes continue medication.He reports good compliance with treatment. He is not having side effects.  He is exercising. He is adherent to low salt diet.   Outside blood pressures are being checked. He is experiencing none.  Patient denies chest pain, irregular heart beat and palpitations.   Cardiovascular risk factors include advanced age (older than 75 for men, 34 for women), dyslipidemia, hypertension and male gender.  Use of agents associated with hypertension: none.   ------------------------------------------------------------------------    Lipid/Cholesterol, Follow-up:   Last seen for this 8 months ago.  Management since that visit includes continue medication.  Last Lipid Panel:    Component Value Date/Time   CHOL 174 05/18/2015 0858   TRIG 78 05/18/2015 0858   HDL 42 05/18/2015 0858   CHOLHDL 4.1 05/18/2015 0858   LDLCALC 116 (H) 05/18/2015 0858    He reports good compliance with treatment. He is not having side effects.   Wt Readings from Last 3 Encounters:  02/01/16 178 lb 12.8 oz (81.1 kg)  07/30/15 177 lb (80.3 kg)  05/18/15 178 lb 9.6 oz (81 kg)    ------------------------------------------------------------------------ Patient Active Problem List   Diagnosis Date Noted  . Low back ache 11/16/2014  . Anxiety 08/21/2014  . Abnormal finding on thyroid function test 08/21/2014  . Benign fibroma of prostate 08/21/2014  . ED (erectile dysfunction) of organic origin  08/21/2014  . Exposure to viral hepatitis 08/21/2014  . Hypercholesteremia 08/21/2014  . Benign hypertension 08/21/2014  . Severe obstructive sleep apnea 08/21/2014  . Sleep disturbance 08/21/2014  . Abnormal prostate specific antigen 10/21/2011  . Benign prostatic hyperplasia with urinary obstruction 10/21/2011  . Elevated prostate specific antigen (PSA) 10/21/2011   Past Surgical History:  Procedure Laterality Date  . AMPUTATION FINGER Left 1965   5TH DIGIT, INDUSTRIAL ACCIDENT  . PROSTATE BIOPSY  03/1998   NEGATIVE   Allergies  Allergen Reactions  . Penicillins   . Sulfa Antibiotics   . Sulfamethoxazole-Trimethoprim      Previous Medications   ASPIRIN 81 MG TABLET    Take 1 tablet by mouth daily.   ATORVASTATIN (LIPITOR) 40 MG TABLET    Take 1 tablet by mouth at bedtime   DIAZEPAM (VALIUM) 5 MG TABLET    Take 1 tablet (5 mg total) by mouth every 8 (eight) hours as needed for anxiety.   DUTASTERIDE (AVODART) 0.5 MG CAPSULE    Take 1 capsule by mouth 3 (three) times a week.   FLURBIPROFEN (ANSAID) 100 MG TABLET    Take 1 tablet (100 mg total) by mouth 2 (two) times daily. As needed for back pain.   LOSARTAN-HYDROCHLOROTHIAZIDE (HYZAAR) 50-12.5 MG TABLET    Take 1 tablet by mouth daily   MELATONIN (RA MELATONIN) 10 MG TABS    Take by mouth.   OMEGA-3 1000 MG CAPS    Take by mouth.   SILDENAFIL (REVATIO) 20 MG TABLET    Take 1 tablet by  mouth daily.   TRAZODONE (DESYREL) 100 MG TABLET    Take 1 tablet (100 mg total) by mouth daily.    Review of Systems  Constitutional: Negative.   Respiratory: Negative.   Cardiovascular: Negative.   Musculoskeletal: Negative.     Social History  Substance Use Topics  . Smoking status: Never Smoker  . Smokeless tobacco: Not on file  . Alcohol use No   Objective:   BP 134/84 (BP Location: Right Arm, Patient Position: Sitting, Cuff Size: Normal)   Pulse 69   Temp 98 F (36.7 C) (Oral)   Resp 16   Wt 178 lb 12.8 oz (81.1 kg)   BMI  27.19 kg/m   Physical Exam  Constitutional: He is oriented to person, place, and time. He appears well-developed and well-nourished. No distress.  HENT:  Head: Normocephalic and atraumatic.  Right Ear: Hearing normal.  Left Ear: Hearing normal.  Nose: Nose normal.  Eyes: Conjunctivae and lids are normal. Right eye exhibits no discharge. Left eye exhibits no discharge. No scleral icterus.  Neck: Neck supple.  Cardiovascular: Normal rate and regular rhythm.   Pulmonary/Chest: Effort normal and breath sounds normal. No respiratory distress.  Abdominal: Soft. Bowel sounds are normal.  Neurological: He is alert and oriented to person, place, and time.  Skin: Skin is intact. No lesion and no rash noted.  Psychiatric: He has a normal mood and affect. His speech is normal and behavior is normal. Thought content normal.      Assessment & Plan:      1. Benign hypertension Stable and tolerating Hyzaar well without hypotensive episodes. Notices BP under better control when using the Sildenafil 20 mg 2 qd (followed by Dr. Bernardo Heater). Recheck labs and follow up pending reports. - CBC with Differential/Platelet - Comprehensive metabolic panel - TSH  2. Hypercholesteremia Continues to take Lipitor 40 mg qd and Omega-3 Fish Oil 3 tablets qd with low fat diet. Recheck labs to assess progress. - Comprehensive metabolic panel - Lipid panel - TSH  3. Anxiety Sleep better and anxiety controlled with Trazodone and Melatonin at bedtime. Recheck routine labs and follow up pending reports. - CBC with Differential/Platelet - TSH

## 2016-02-02 LAB — CBC WITH DIFFERENTIAL/PLATELET
BASOS: 1 %
Basophils Absolute: 0 10*3/uL (ref 0.0–0.2)
EOS (ABSOLUTE): 0.3 10*3/uL (ref 0.0–0.4)
Eos: 5 %
Hematocrit: 46.9 % (ref 37.5–51.0)
Hemoglobin: 16.1 g/dL (ref 13.0–17.7)
IMMATURE GRANULOCYTES: 0 %
Immature Grans (Abs): 0 10*3/uL (ref 0.0–0.1)
Lymphocytes Absolute: 2.1 10*3/uL (ref 0.7–3.1)
Lymphs: 36 %
MCH: 31 pg (ref 26.6–33.0)
MCHC: 34.3 g/dL (ref 31.5–35.7)
MCV: 90 fL (ref 79–97)
Monocytes Absolute: 0.6 10*3/uL (ref 0.1–0.9)
Monocytes: 10 %
NEUTROS ABS: 2.9 10*3/uL (ref 1.4–7.0)
NEUTROS PCT: 48 %
PLATELETS: 227 10*3/uL (ref 150–379)
RBC: 5.2 x10E6/uL (ref 4.14–5.80)
RDW: 13.2 % (ref 12.3–15.4)
WBC: 5.8 10*3/uL (ref 3.4–10.8)

## 2016-02-02 LAB — COMPREHENSIVE METABOLIC PANEL
A/G RATIO: 2 (ref 1.2–2.2)
ALT: 21 IU/L (ref 0–44)
AST: 20 IU/L (ref 0–40)
Albumin: 4.4 g/dL (ref 3.6–4.8)
Alkaline Phosphatase: 58 IU/L (ref 39–117)
BILIRUBIN TOTAL: 0.8 mg/dL (ref 0.0–1.2)
BUN/Creatinine Ratio: 15 (ref 10–24)
BUN: 17 mg/dL (ref 8–27)
CHLORIDE: 100 mmol/L (ref 96–106)
CO2: 26 mmol/L (ref 18–29)
Calcium: 9.5 mg/dL (ref 8.6–10.2)
Creatinine, Ser: 1.15 mg/dL (ref 0.76–1.27)
GFR calc Af Amer: 75 mL/min/{1.73_m2} (ref >59)
GFR calc non Af Amer: 65 mL/min/{1.73_m2} (ref >59)
Globulin, Total: 2.2 g/dL (ref 1.5–4.5)
Glucose: 103 mg/dL — ABNORMAL HIGH (ref 65–99)
POTASSIUM: 4.1 mmol/L (ref 3.5–5.2)
Sodium: 143 mmol/L (ref 134–144)
Total Protein: 6.6 g/dL (ref 6.0–8.5)

## 2016-02-02 LAB — LIPID PANEL
Chol/HDL Ratio: 4.1 ratio units (ref 0.0–5.0)
Cholesterol, Total: 168 mg/dL (ref 100–199)
HDL: 41 mg/dL (ref >39)
LDL Calculated: 110 mg/dL — ABNORMAL HIGH (ref 0–99)
TRIGLYCERIDES: 84 mg/dL (ref 0–149)
VLDL Cholesterol Cal: 17 mg/dL (ref 5–40)

## 2016-02-02 LAB — TSH: TSH: 4.27 u[IU]/mL (ref 0.450–4.500)

## 2016-02-04 ENCOUNTER — Telehealth: Payer: Self-pay

## 2016-02-04 NOTE — Telephone Encounter (Signed)
-----   Message from Margo Common, [place] sent at 02/04/2016  9:39 AM EST ----- Blood tests in good shape. Continue present medication regimen and recheck appointment in 6 months.

## 2016-02-04 NOTE — Telephone Encounter (Signed)
Patient's wife Michael [name] (on consent)  advised as directed below.

## 2016-04-24 ENCOUNTER — Ambulatory Visit: Payer: Self-pay | Admitting: Family Medicine

## 2016-04-29 ENCOUNTER — Ambulatory Visit (INDEPENDENT_AMBULATORY_CARE_PROVIDER_SITE_OTHER): Payer: Medicare HMO | Admitting: Family Medicine

## 2016-04-29 ENCOUNTER — Encounter: Payer: Self-pay | Admitting: Family Medicine

## 2016-04-29 VITALS — BP 128/72 | HR 72 | Temp 97.7°F | Wt 181.4 lb

## 2016-04-29 DIAGNOSIS — R252 Cramp and spasm: Secondary | ICD-10-CM | POA: Diagnosis not present

## 2016-04-29 DIAGNOSIS — R5383 Other fatigue: Secondary | ICD-10-CM

## 2016-04-29 DIAGNOSIS — E7800 Pure hypercholesterolemia, unspecified: Secondary | ICD-10-CM | POA: Diagnosis not present

## 2016-04-29 DIAGNOSIS — R202 Paresthesia of skin: Secondary | ICD-10-CM

## 2016-04-29 NOTE — Patient Instructions (Signed)
Leg Cramps Leg cramps occur when a muscle or muscles tighten and you have no control over this tightening (involuntary muscle contraction). Muscle cramps can develop in any muscle, but the most common place is in the calf muscles of the leg. Those cramps can occur during exercise or when you are at rest. Leg cramps are painful, and they may last for a few seconds to a few minutes. Cramps may return several times before they finally stop. Usually, leg cramps are not caused by a serious medical problem. In many cases, the cause is not known. Some common causes include:  Overexertion.  Overuse from repetitive motions, or doing the same thing over and over.  Remaining in a certain position for a long period of time.  Improper preparation, form, or technique while performing a sport or an activity.  Dehydration.  Injury.  Side effects of some medicines.  Abnormally low levels of the salts and ions in your blood (electrolytes), especially potassium and calcium. These levels could be low if you are taking water pills (diuretics) or if you are pregnant. Follow these instructions at home: Watch your condition for any changes. Taking the following actions may help to lessen any discomfort that you are feeling:  Stay well-hydrated. Drink enough fluid to keep your urine clear or pale yellow.  Try massaging, stretching, and relaxing the affected muscle. Do this for several minutes at a time.  For tight or tense muscles, use a warm towel, heating pad, or hot shower water directed to the affected area.  If you are sore or have pain after a cramp, applying ice to the affected area may relieve discomfort.  Put ice in a plastic bag.  Place a towel between your skin and the bag.  Leave the ice on for 20 minutes, 2-3 times per day.  Avoid strenuous exercise for several days if you have been having frequent leg cramps.  Make sure that your diet includes the essential minerals for your muscles to  work normally.  Take medicines only as directed by your health care provider. Contact a health care provider if:  Your leg cramps get more severe or more frequent, or they do not improve over time.  Your foot becomes cold, numb, or blue. This information is not intended to replace advice given to you by your health care provider. Make sure you discuss any questions you have with your health care provider. Document Released: 03/20/2004 Document Revised: 07/19/2015 Document Reviewed: 01/18/2014 Elsevier Interactive Patient Education  2017 Elsevier Inc[employer]  

## 2016-04-29 NOTE — Progress Notes (Signed)
Patient: Michael [name] Male    DOB: 1945-07-15   71 y.o.   MRN: [id number] Visit Date: 04/29/2016  Today's Provider: Vernie Murders, PA   Chief Complaint  Patient presents with  . Leg Pain   Subjective:    Leg Pain   The incident occurred more than 1 week ago. There was no injury mechanism. The pain is present in the left leg and right leg. The quality of the pain is described as cramping. The pain is severe. The pain has been worsening since onset.  Patient reports right leg pain and cramps is worse than left leg. Worse at night and when off his feet. Pins and needles sensation with aching nearly constantly. Not drinking much water because it causes more frequent urination. Onset over the past 4 weeks.  Patient Active Problem List   Diagnosis Date Noted  . Low back ache 11/16/2014  . Anxiety 08/21/2014  . Abnormal finding on thyroid function test 08/21/2014  . Benign fibroma of prostate 08/21/2014  . ED (erectile dysfunction) of organic origin 08/21/2014  . Exposure to viral hepatitis 08/21/2014  . Hypercholesteremia 08/21/2014  . Benign hypertension 08/21/2014  . Severe obstructive sleep apnea 08/21/2014  . Sleep disturbance 08/21/2014  . Abnormal prostate specific antigen 10/21/2011  . Benign prostatic hyperplasia with urinary obstruction 10/21/2011  . Elevated prostate specific antigen (PSA) 10/21/2011   Past Surgical History:  Procedure Laterality Date  . AMPUTATION FINGER Left 1965   5TH DIGIT, INDUSTRIAL ACCIDENT  . PROSTATE BIOPSY  03/1998   NEGATIVE   Family History  Problem Relation Age of Onset  . Emphysema Mother   . Diabetes Maternal Uncle    Allergies  Allergen Reactions  . Penicillins   . Sulfa Antibiotics   . Sulfamethoxazole-Trimethoprim      Previous Medications   ASPIRIN 81 MG TABLET    Take 1 tablet by mouth daily.   ATORVASTATIN (LIPITOR) 40 MG TABLET    Take 1 tablet by mouth at bedtime   DIAZEPAM (VALIUM) 5 MG TABLET    Take 1 tablet (5 mg  total) by mouth every 8 (eight) hours as needed for anxiety.   DUTASTERIDE (AVODART) 0.5 MG CAPSULE    Take 1 capsule by mouth 3 (three) times a week.   FLURBIPROFEN (ANSAID) 100 MG TABLET    Take 1 tablet (100 mg total) by mouth 2 (two) times daily. As needed for back pain.   LOSARTAN-HYDROCHLOROTHIAZIDE (HYZAAR) 50-12.5 MG TABLET    Take 1 tablet by mouth daily   MELATONIN (RA MELATONIN) 10 MG TABS    Take by mouth.   OMEGA-3 1000 MG CAPS    Take by mouth.   SILDENAFIL (REVATIO) 20 MG TABLET    Take 1 tablet by mouth daily.   TRAZODONE (DESYREL) 100 MG TABLET    Take 1 tablet (100 mg total) by mouth daily.    Review of Systems  Constitutional: Negative.   Respiratory: Negative.   Cardiovascular: Negative.   Musculoskeletal: Positive for myalgias.    Social History  Substance Use Topics  . Smoking status: Never Smoker  . Smokeless tobacco: Never Used  . Alcohol use No   Objective:   BP 128/72 (BP Location: Right Arm, Patient Position: Sitting, Cuff Size: Normal)   Pulse 72   Temp 97.7 F (36.5 C) (Oral)   Wt 181 lb 6.4 oz (82.3 kg)   SpO2 98%   BMI 27.58 kg/m  Wt Readings from Last 3 Encounters:  04/29/16  181 lb 6.4 oz (82.3 kg)  02/01/16 178 lb 12.8 oz (81.1 kg)  07/30/15 177 lb (80.3 kg)   BP Readings from Last 3 Encounters:  04/29/16 128/72  02/01/16 134/84  07/30/15 136/70   Physical Exam  Constitutional: He is oriented to person, place, and time. He appears well-developed and well-nourished. No distress.  HENT:  Head: Normocephalic and atraumatic.  Right Ear: Hearing normal.  Left Ear: Hearing normal.  Nose: Nose normal.  Eyes: Conjunctivae and lids are normal. Right eye exhibits no discharge. Left eye exhibits no discharge. No scleral icterus.  Neck: Neck supple.  Cardiovascular: Normal rate, regular rhythm and intact distal pulses.   Pulmonary/Chest: Effort normal and breath sounds normal. No respiratory distress.  Abdominal: Soft. Bowel sounds are normal.   Musculoskeletal:  No muscle weakness. Some intermittent muscle soreness and cramps.   Neurological: He is alert and oriented to person, place, and time.  Skin: Skin is intact. No lesion and no rash noted.  Psychiatric: He has a normal mood and affect. His speech is normal and behavior is normal. Thought content normal.      Assessment & Plan:     1. Muscle cramps Onset over the past week. No known injury. Needs to drink extra fluids. Will get labs to evaluate potassium or magnesium level. May use Tonic Water with Quinine 4-6 ounces with juice or soft drink each night. Worried about a reaction to the Atorvastatin. Recheck pending lab reports. - CBC with Differential/Platelet - Comprehensive metabolic panel - C-reactive protein - Magnesium - CK (Creatine Kinase)  2. Paresthesias Intermittent tingling of extremities with some cramps. No muscle weakness or skin changes. No Raynaud's phenomena.  - CBC with Differential/Platelet - Comprehensive metabolic panel - Magnesium  3. Fatigue, unspecified type Tire easily but no dyspnea or palpitations. Eating well. History of anxiety. Recommend labs to rule out metabolic disorder. - CBC with Differential/Platelet - Comprehensive metabolic panel - Magnesium - CK (Creatine Kinase)  4. Hypercholesteremia Has been tolerating Lipitor 40 mg qd without side effects. Will check labs to rule out muscle destruction (rhabdomyolysis). Last total cholesterol 168, HDL 41 and LDL 110. Will hold Lipitor and check CMP, CRP and CK. - Comprehensive metabolic panel - C-reactive protein - CK (Creatine Kinase)

## 2016-04-30 LAB — CBC WITH DIFFERENTIAL/PLATELET
BASOS: 0 %
Basophils Absolute: 0 10*3/uL (ref 0.0–0.2)
EOS (ABSOLUTE): 0.3 10*3/uL (ref 0.0–0.4)
Eos: 3 %
Hematocrit: 45.8 % (ref 37.5–51.0)
Hemoglobin: 15.8 g/dL (ref 13.0–17.7)
Immature Grans (Abs): 0 10*3/uL (ref 0.0–0.1)
Immature Granulocytes: 0 %
LYMPHS ABS: 2.9 10*3/uL (ref 0.7–3.1)
Lymphs: 34 %
MCH: 31.2 pg (ref 26.6–33.0)
MCHC: 34.5 g/dL (ref 31.5–35.7)
MCV: 91 fL (ref 79–97)
MONOS ABS: 0.9 10*3/uL (ref 0.1–0.9)
Monocytes: 11 %
NEUTROS PCT: 52 %
Neutrophils Absolute: 4.5 10*3/uL (ref 1.4–7.0)
PLATELETS: 207 10*3/uL (ref 150–379)
RBC: 5.06 x10E6/uL (ref 4.14–5.80)
RDW: 13.4 % (ref 12.3–15.4)
WBC: 8.5 10*3/uL (ref 3.4–10.8)

## 2016-04-30 LAB — MAGNESIUM: MAGNESIUM: 2 mg/dL (ref 1.6–2.3)

## 2016-04-30 LAB — C-REACTIVE PROTEIN: CRP: 0.4 mg/L (ref 0.0–4.9)

## 2016-04-30 LAB — COMPREHENSIVE METABOLIC PANEL
A/G RATIO: 2.2 (ref 1.2–2.2)
ALK PHOS: 66 IU/L (ref 39–117)
ALT: 18 IU/L (ref 0–44)
AST: 18 IU/L (ref 0–40)
Albumin: 4.6 g/dL (ref 3.6–4.8)
BUN/Creatinine Ratio: 15 (ref 10–24)
BUN: 17 mg/dL (ref 8–27)
Bilirubin Total: 0.4 mg/dL (ref 0.0–1.2)
CALCIUM: 10 mg/dL (ref 8.6–10.2)
CHLORIDE: 98 mmol/L (ref 96–106)
CO2: 28 mmol/L (ref 18–29)
Creatinine, Ser: 1.12 mg/dL (ref 0.76–1.27)
GFR calc Af Amer: 78 mL/min/{1.73_m2} (ref >59)
GFR, EST NON AFRICAN AMERICAN: 67 mL/min/{1.73_m2} (ref >59)
GLOBULIN, TOTAL: 2.1 g/dL (ref 1.5–4.5)
Glucose: 83 mg/dL (ref 65–99)
Potassium: 4.1 mmol/L (ref 3.5–5.2)
SODIUM: 141 mmol/L (ref 134–144)
Total Protein: 6.7 g/dL (ref 6.0–8.5)

## 2016-04-30 LAB — CK: CK TOTAL: 158 U/L (ref 24–204)

## 2016-05-01 ENCOUNTER — Telehealth: Payer: Self-pay

## 2016-05-01 NOTE — Telephone Encounter (Signed)
Patient advised. Patient states after increasing water intake yesterday and today he feels 50% better.

## 2016-05-01 NOTE — Telephone Encounter (Signed)
-----   Message from Margo Common, [place] sent at 05/01/2016 10:28 AM EST ----- Normal magnesium, potassium, calcium and no sign of muscle inflammation or degradation from Atrovastatin. May try 4 ounces of tonic water with apple juice each evening to help with muscle cramps. If having more cramps with walking, may have to get test for circulation evaluation.

## 2016-05-29 ENCOUNTER — Other Ambulatory Visit: Payer: Self-pay | Admitting: Family Medicine

## 2016-05-29 MED ORDER — TRAZODONE HCL 100 MG PO TABS: 100.0000 mg | ORAL_TABLET | Freq: Every day | ORAL | 3 refills | Status: DC

## 2016-05-29 MED ORDER — LOSARTAN POTASSIUM-HCTZ 50-12.5 MG PO TABS: 1.0000 | ORAL_TABLET | Freq: Every day | ORAL | 3 refills | Status: DC

## 2016-05-29 MED ORDER — ATORVASTATIN CALCIUM 40 MG PO TABS: 40.0000 mg | ORAL_TABLET | Freq: Every day | ORAL | 3 refills | Status: DC

## 2016-05-29 NOTE — Telephone Encounter (Signed)
[facility] faxed refill request for the following medications: 1. atorvastatin (LIPITOR) 40 MG tablet  2. losartan-hydrochlorothiazide (HYZAAR) 50-12.5 MG tablet  3. traZODone (DESYREL) 100 MG tablet  I contacted pt since [facility] wasn't on his preferred pharmacies and pt confirmed that since his insurance changed he is now using [facility]. Please advise. Thanks TNP

## 2016-05-30 ENCOUNTER — Telehealth: Payer: Self-pay

## 2016-05-30 ENCOUNTER — Ambulatory Visit
Admission: RE | Admit: 2016-05-30 | Discharge: 2016-05-30 | Disposition: A | Discharge status: Home / Self Care | Payer: Medicare HMO | Source: Ambulatory Visit | Attending: Family Medicine | Admitting: Family Medicine

## 2016-05-30 ENCOUNTER — Telehealth: Payer: Self-pay | Admitting: Family Medicine

## 2016-05-30 ENCOUNTER — Ambulatory Visit (INDEPENDENT_AMBULATORY_CARE_PROVIDER_SITE_OTHER): Payer: Medicare HMO | Admitting: Family Medicine

## 2016-05-30 ENCOUNTER — Encounter: Payer: Self-pay | Admitting: Family Medicine

## 2016-05-30 VITALS — BP 140/72 | HR 78 | Temp 97.7°F | Wt 179.6 lb

## 2016-05-30 DIAGNOSIS — M25561 Pain in right knee: Secondary | ICD-10-CM | POA: Insufficient documentation

## 2016-05-30 DIAGNOSIS — M1711 Unilateral primary osteoarthritis, right knee: Secondary | ICD-10-CM | POA: Diagnosis not present

## 2016-05-30 NOTE — Telephone Encounter (Signed)
Wife advised. Renaldo Fiddler, CMA

## 2016-05-30 NOTE — Telephone Encounter (Signed)
-----   Message from [place], [place] sent at 05/30/2016  1:43 PM EDT ----- No sign of posterior knee arthritis but some early degenerative spurring (arthritis) of the kneecap. Discomfort in the back of the knee seems to be muscle and ligament/tendon inflammation and strain. Use the  Aspercreme with Lidocaine to that area 3 times a day. Continue light exercises but no heavy lifting with this leg for 5-7 days. Recheck as needed.

## 2016-05-30 NOTE — Progress Notes (Signed)
Patient: Michael [name] Male    DOB: August 22, 1945   71 y.o.   MRN: [id number] Visit Date: 05/30/2016  Today's Provider: Vernie Murders, PA   Chief Complaint  Patient presents with  . Leg Pain   Subjective:    HPI Patient is here to follow up for right leg pain. He was seen in the office on 04/29/2016. Labs were normal. Patient was advised to use 4 oz. of tonic water with apple juice each morning for muscle cramps. He reports good compliance with recommendation. Patient states leg pain has improved, but pain remains behind right knee. He is requesting MRI.  Patient Active Problem List   Diagnosis Date Noted  . Low back ache 11/16/2014  . Anxiety 08/21/2014  . Abnormal finding on thyroid function test 08/21/2014  . Benign fibroma of prostate 08/21/2014  . ED (erectile dysfunction) of organic origin 08/21/2014  . Exposure to viral hepatitis 08/21/2014  . Hypercholesteremia 08/21/2014  . Benign hypertension 08/21/2014  . Severe obstructive sleep apnea 08/21/2014  . Sleep disturbance 08/21/2014  . Abnormal prostate specific antigen 10/21/2011  . Benign prostatic hyperplasia with urinary obstruction 10/21/2011  . Elevated prostate specific antigen (PSA) 10/21/2011   Past Surgical History:  Procedure Laterality Date  . AMPUTATION FINGER Left 1965   5TH DIGIT, INDUSTRIAL ACCIDENT  . PROSTATE BIOPSY  03/1998   NEGATIVE   Family History  Problem Relation Age of Onset  . Emphysema Mother   . Diabetes Maternal Uncle    Allergies  Allergen Reactions  . Penicillins   . Sulfa Antibiotics   . Sulfamethoxazole-Trimethoprim      Previous Medications   ASPIRIN 81 MG TABLET    Take 1 tablet by mouth daily.   ATORVASTATIN (LIPITOR) 40 MG TABLET    Take 1 tablet (40 mg total) by mouth at bedtime.   DIAZEPAM (VALIUM) 5 MG TABLET    Take 1 tablet (5 mg total) by mouth every 8 (eight) hours as needed for anxiety.   DUTASTERIDE (AVODART) 0.5 MG CAPSULE    Take 1 capsule by mouth 3 (three)  times a week.   FLURBIPROFEN (ANSAID) 100 MG TABLET    Take 1 tablet (100 mg total) by mouth 2 (two) times daily. As needed for back pain.   LOSARTAN-HYDROCHLOROTHIAZIDE (HYZAAR) 50-12.5 MG TABLET    Take 1 tablet by mouth daily.   MELATONIN (RA MELATONIN) 10 MG TABS    Take by mouth.   OMEGA-3 1000 MG CAPS    Take by mouth.   SILDENAFIL (REVATIO) 20 MG TABLET    Take 1 tablet by mouth daily.   TRAZODONE (DESYREL) 100 MG TABLET    Take 1 tablet (100 mg total) by mouth daily.    Review of Systems  Constitutional: Negative.   Respiratory: Negative.   Cardiovascular: Negative.   Musculoskeletal:       Right leg pain     Social History  Substance Use Topics  . Smoking status: Never Smoker  . Smokeless tobacco: Never Used  . Alcohol use No   Objective:   BP 140/72 (BP Location: Right Arm, Patient Position: Sitting, Cuff Size: Normal)   Pulse 78   Temp 97.7 F (36.5 C) (Oral)   Wt 179 lb 9.6 oz (81.5 kg)   SpO2 98%   BMI 27.31 kg/m   Physical Exam  Constitutional: He is oriented to person, place, and time. He appears well-developed and well-nourished. No distress.  HENT:  Head: Normocephalic and atraumatic.  Right  Ear: Hearing normal.  Left Ear: Hearing normal.  Nose: Nose normal.  Eyes: Conjunctivae and lids are normal. Right eye exhibits no discharge. Left eye exhibits no discharge. No scleral icterus.  Pulmonary/Chest: Effort normal. No respiratory distress.  Musculoskeletal: Normal range of motion.  Slight tenderness in right popliteal fossa to hyperextend the knee. Good pulses and no edema. Full ROM and no crepitus or locking.   Neurological: He is alert and oriented to person, place, and time.  Skin: Skin is intact. No lesion and no rash noted.  Psychiatric: He has a normal mood and affect. His speech is normal and behavior is normal. Thought content normal.      Assessment & Plan:     1. Right knee pain, unspecified chronicity Onset over the past 6-8 weeks  intermittently. Described as an ache and stiffness behind the knee. No specific injury known. May use the Ansaid he has at home. Will get x-ray of the knee and recheck pending report. - DG Knee Complete 4 Views Right

## 2016-06-03 ENCOUNTER — Other Ambulatory Visit: Payer: Self-pay

## 2016-06-03 NOTE — Telephone Encounter (Signed)
Was sent electronically to [facility] on 05-29-16 with confirmation of receipt at 10:39 am that day.

## 2016-06-03 NOTE — Telephone Encounter (Signed)
Refill request from [facility]

## 2016-07-02 DIAGNOSIS — N401 Enlarged prostate with lower urinary tract symptoms: Secondary | ICD-10-CM | POA: Diagnosis not present

## 2016-07-02 DIAGNOSIS — R972 Elevated prostate specific antigen [PSA]: Secondary | ICD-10-CM | POA: Diagnosis not present

## 2016-07-29 DIAGNOSIS — D485 Neoplasm of uncertain behavior of skin: Secondary | ICD-10-CM | POA: Diagnosis not present

## 2016-07-29 DIAGNOSIS — L570 Actinic keratosis: Secondary | ICD-10-CM | POA: Diagnosis not present

## 2016-07-29 DIAGNOSIS — D0461 Carcinoma in situ of skin of right upper limb, including shoulder: Secondary | ICD-10-CM | POA: Diagnosis not present

## 2016-07-29 DIAGNOSIS — X32XXXA Exposure to sunlight, initial encounter: Secondary | ICD-10-CM | POA: Diagnosis not present

## 2016-07-31 ENCOUNTER — Other Ambulatory Visit: Payer: Self-pay | Admitting: Family Medicine

## 2016-07-31 MED ORDER — FLURBIPROFEN 100 MG PO TABS: 100.0000 mg | ORAL_TABLET | Freq: Two times a day (BID) | ORAL | 1 refills | Status: DC

## 2016-07-31 NOTE — Telephone Encounter (Signed)
Pt needs refill flurbiprofen (ANSAID) 100 MG tablet  [facility].  Thanks [employer]

## 2016-08-08 DIAGNOSIS — D0461 Carcinoma in situ of skin of right upper limb, including shoulder: Secondary | ICD-10-CM | POA: Diagnosis not present

## 2016-08-08 DIAGNOSIS — L570 Actinic keratosis: Secondary | ICD-10-CM | POA: Diagnosis not present

## 2016-10-29 ENCOUNTER — Telehealth: Payer: Self-pay | Admitting: Family Medicine

## 2016-11-14 DIAGNOSIS — K5730 Diverticulosis of large intestine without perforation or abscess without bleeding: Secondary | ICD-10-CM | POA: Diagnosis not present

## 2016-11-14 DIAGNOSIS — Z880 Allergy status to penicillin: Secondary | ICD-10-CM | POA: Diagnosis not present

## 2016-11-14 DIAGNOSIS — Z79899 Other long term (current) drug therapy: Secondary | ICD-10-CM | POA: Diagnosis not present

## 2016-11-14 DIAGNOSIS — Z7982 Long term (current) use of aspirin: Secondary | ICD-10-CM | POA: Diagnosis not present

## 2016-11-14 DIAGNOSIS — Z1211 Encounter for screening for malignant neoplasm of colon: Secondary | ICD-10-CM | POA: Diagnosis not present

## 2016-11-14 DIAGNOSIS — Z86010 Personal history of colonic polyps: Secondary | ICD-10-CM | POA: Diagnosis not present

## 2016-11-14 LAB — HM COLONOSCOPY

## 2016-12-09 ENCOUNTER — Ambulatory Visit (INDEPENDENT_AMBULATORY_CARE_PROVIDER_SITE_OTHER): Payer: Medicare HMO | Admitting: Family Medicine

## 2016-12-09 ENCOUNTER — Encounter: Payer: Self-pay | Admitting: Family Medicine

## 2016-12-09 VITALS — BP 122/78 | HR 59 | Temp 97.7°F | Ht 68.0 in | Wt 176.2 lb

## 2016-12-09 DIAGNOSIS — I10 Essential (primary) hypertension: Secondary | ICD-10-CM | POA: Diagnosis not present

## 2016-12-09 DIAGNOSIS — N138 Other obstructive and reflux uropathy: Secondary | ICD-10-CM | POA: Diagnosis not present

## 2016-12-09 DIAGNOSIS — N401 Enlarged prostate with lower urinary tract symptoms: Secondary | ICD-10-CM | POA: Diagnosis not present

## 2016-12-09 DIAGNOSIS — N529 Male erectile dysfunction, unspecified: Secondary | ICD-10-CM

## 2016-12-09 DIAGNOSIS — L570 Actinic keratosis: Secondary | ICD-10-CM | POA: Diagnosis not present

## 2016-12-09 DIAGNOSIS — G479 Sleep disorder, unspecified: Secondary | ICD-10-CM | POA: Diagnosis not present

## 2016-12-09 DIAGNOSIS — E7800 Pure hypercholesterolemia, unspecified: Secondary | ICD-10-CM

## 2016-12-09 LAB — CBC WITH DIFFERENTIAL/PLATELET
BASOS ABS: 41 {cells}/uL (ref 0–200)
BASOS PCT: 0.6 %
EOS PCT: 4.3 %
Eosinophils Absolute: 292 cells/uL (ref 15–500)
HCT: 49.6 % (ref 38.5–50.0)
HEMOGLOBIN: 16.9 g/dL (ref 13.2–17.1)
Lymphs Abs: 2040 cells/uL (ref 850–3900)
MCH: 30.9 pg (ref 27.0–33.0)
MCHC: 34.1 g/dL (ref 32.0–36.0)
MCV: 90.7 fL (ref 80.0–100.0)
MONOS PCT: 10.6 %
MPV: 9.8 fL (ref 7.5–12.5)
NEUTROS ABS: 3706 {cells}/uL (ref 1500–7800)
Neutrophils Relative %: 54.5 %
PLATELETS: 246 10*3/uL (ref 140–400)
RBC: 5.47 10*6/uL (ref 4.20–5.80)
RDW: 11.8 % (ref 11.0–15.0)
Total Lymphocyte: 30 %
WBC mixed population: 721 cells/uL (ref 200–950)
WBC: 6.8 10*3/uL (ref 3.8–10.8)

## 2016-12-09 LAB — COMPLETE METABOLIC PANEL WITH GFR
AG Ratio: 2 (calc) (ref 1.0–2.5)
ALT: 20 U/L (ref 9–46)
AST: 18 U/L (ref 10–35)
Albumin: 4.5 g/dL (ref 3.6–5.1)
Alkaline phosphatase (APISO): 57 U/L (ref 40–115)
BUN: 18 mg/dL (ref 7–25)
CALCIUM: 9.8 mg/dL (ref 8.6–10.3)
CHLORIDE: 100 mmol/L (ref 98–110)
CO2: 32 mmol/L (ref 20–32)
Creat: 1.09 mg/dL (ref 0.70–1.25)
GFR, EST AFRICAN AMERICAN: 80 mL/min/{1.73_m2} (ref >=60)
GFR, EST NON AFRICAN AMERICAN: 69 mL/min/{1.73_m2} (ref >=60)
GLUCOSE: 100 mg/dL — AB (ref 65–99)
Globulin: 2.3 g/dL (calc) (ref 1.9–3.7)
POTASSIUM: 4.3 mmol/L (ref 3.5–5.3)
Sodium: 139 mmol/L (ref 135–146)
TOTAL PROTEIN: 6.8 g/dL (ref 6.1–8.1)
Total Bilirubin: 0.9 mg/dL (ref 0.2–1.2)

## 2016-12-09 LAB — LIPID PANEL
Cholesterol: 172 mg/dL (ref <200)
HDL: 49 mg/dL (ref >40)
LDL Cholesterol (Calc): 107 mg/dL (calc) — ABNORMAL HIGH
NON-HDL CHOLESTEROL (CALC): 123 mg/dL (ref <130)
Total CHOL/HDL Ratio: 3.5 (calc) (ref <5.0)
Triglycerides: 73 mg/dL (ref <150)

## 2016-12-09 LAB — TSH: TSH: 3.39 m[IU]/L (ref 0.40–4.50)

## 2016-12-09 NOTE — Progress Notes (Signed)
Patient: Michael [name] Male    DOB: 10/31/1945   71 y.o.   MRN: [id number] Visit Date: 12/09/2016  Today's Provider: Vernie Murders, PA   Chief Complaint  Patient presents with  . Hypertension  . Hyperlipidemia  . Follow-up   Subjective:    HPI  Hypertension, follow-up:  BP Readings from Last 3 Encounters:  12/09/16 122/78  05/30/16 140/72  04/29/16 128/72    He was last seen for hypertension 6 months ago.  BP at that visit was 140/72. Management since that visit includes continue medication. He reports good compliance with treatment. He is not having side effects.  He is exercising. He is adherent to low salt diet.   Outside blood pressures are being checked. He is experiencing none.  Patient denies chest pain, irregular heart beat and palpitations.   Cardiovascular risk factors include advanced age (older than 67 for men, 63 for women), dyslipidemia, hypertension and male gender.  Use of agents associated with hypertension: none.   ------------------------------------------------------------------------    Lipid/Cholesterol, Follow-up:   Last seen for this 6 months ago.  Management since that visit includes continue medication.  Last Lipid Panel: Labs(Brief)   Lab Results  Component Value Date   CHOL 168 02/01/2016   HDL 41 02/01/2016   LDLCALC 110 (H) 02/01/2016   TRIG 84 02/01/2016   CHOLHDL 4.1 02/01/2016    He reports good compliance with treatment. He is not having side effects.   Wt Readings from Last 3 Encounters:  12/09/16 176 lb 3.2 oz (79.9 kg)  05/30/16 179 lb 9.6 oz (81.5 kg)  04/29/16 181 lb 6.4 oz (82.3 kg)    ------------------------------------------------------------------------ No past medical history on file. Patient Active Problem List   Diagnosis Date Noted  . Low back ache 11/16/2014  . Anxiety 08/21/2014  . Abnormal finding on thyroid function test 08/21/2014  . Benign fibroma of prostate 08/21/2014  .  ED (erectile dysfunction) of organic origin 08/21/2014  . Exposure to viral hepatitis 08/21/2014  . Hypercholesteremia 08/21/2014  . Benign hypertension 08/21/2014  . Severe obstructive sleep apnea 08/21/2014  . Sleep disturbance 08/21/2014  . Abnormal prostate specific antigen 10/21/2011  . Benign prostatic hyperplasia with urinary obstruction 10/21/2011  . Elevated prostate specific antigen (PSA) 10/21/2011   Past Surgical History:  Procedure Laterality Date  . AMPUTATION FINGER Left 1965   5TH DIGIT, INDUSTRIAL ACCIDENT  . PROSTATE BIOPSY  03/1998   NEGATIVE   Family History  Problem Relation Age of Onset  . Emphysema Mother   . Diabetes Maternal Uncle    Allergies  Allergen Reactions  . Penicillins   . Sulfa Antibiotics   . Sulfamethoxazole-Trimethoprim      Previous Medications   ASPIRIN 81 MG TABLET    Take 1 tablet by mouth daily.   ATORVASTATIN (LIPITOR) 40 MG TABLET    Take 1 tablet (40 mg total) by mouth at bedtime.   DIAZEPAM (VALIUM) 5 MG TABLET    Take 1 tablet (5 mg total) by mouth every 8 (eight) hours as needed for anxiety.   DUTASTERIDE (AVODART) 0.5 MG CAPSULE    Take 1 capsule by mouth 3 (three) times a week.   FLURBIPROFEN (ANSAID) 100 MG TABLET    Take 1 tablet (100 mg total) by mouth 2 (two) times daily. As needed for back pain.   LOSARTAN-HYDROCHLOROTHIAZIDE (HYZAAR) 50-12.5 MG TABLET    Take 1 tablet by mouth daily.   MELATONIN (RA MELATONIN) 10 MG TABS  Take by mouth.   OMEGA-3 1000 MG CAPS    Take by mouth.   SILDENAFIL (REVATIO) 20 MG TABLET    Take 1 tablet by mouth daily.   TRAZODONE (DESYREL) 100 MG TABLET    Take 1 tablet (100 mg total) by mouth daily.    Review of Systems  Constitutional: Negative.   Respiratory: Negative.   Cardiovascular: Negative.     Social History  Substance Use Topics  . Smoking status: Never Smoker  . Smokeless tobacco: Never Used  . Alcohol use No   Objective:   BP 122/78 (BP Location: Right Arm, Patient  Position: Sitting, Cuff Size: Normal)   Pulse (!) 59   Temp 97.7 F (36.5 C) (Oral)   Ht 5\' 8"  (1.727 m)   Wt 176 lb 3.2 oz (79.9 kg)   SpO2 97%   BMI 26.79 kg/m   Physical Exam  Constitutional: He is oriented to person, place, and time. He appears well-developed and well-nourished. No distress.  HENT:  Head: Normocephalic and atraumatic.  Right Ear: Hearing and external ear normal.  Left Ear: Hearing and external ear normal.  Nose: Nose normal.  Mouth/Throat: Oropharynx is clear and moist.  Eyes: Conjunctivae and lids are normal. Right eye exhibits no discharge. Left eye exhibits no discharge. No scleral icterus.  Neck: Neck supple.  Cardiovascular: Normal rate.   Pulmonary/Chest: Effort normal. No respiratory distress.  Abdominal: Soft. Bowel sounds are normal.  Musculoskeletal: Normal range of motion.  Neurological: He is alert and oriented to person, place, and time.  Skin: Skin is intact. No lesion and no rash noted.  History of many actinic keratoses of face and treated by dermatologist (Dr. Evorn Gong).  Psychiatric: He has a normal mood and affect. His speech is normal and behavior is normal. Thought content normal.   Assessment & Plan:     1. Benign hypertension Very good control of BP with continued use of Hyzaar 50/12.5 mg qd. No chest pains, dyspnea, palpitations or edema. Recheck labs and continue present dosage. Follow low fat salt restricted diet. Follow up appointment pending lab reports. - CBC with Differential/Platelet - Comprehensive metabolic panel - TSH  2. Hypercholesteremia Tolerating Atorvastatin 40 mg qd with Omega-3 Fish Oil 1000 mg qd. No myalgias or joint pains. Recheck CMP, TSH and Lipid Panel. Follow up pending reports. - Comprehensive metabolic panel - Lipid panel - TSH  3. Sleep disturbance Anxiety disorder and sleep disturbance well controlled by Trazodone 100 mg qd without daytime drowsiness. Continue present regimen and recheck TSH. -  TSH  4. Benign prostatic hyperplasia with urinary obstruction Last PSA 2.81 on 07-02-16 with no new changes on DRE by Dr. Bernardo Heater. Continue follow up annually with him.  5. ED (erectile dysfunction) of organic origin Continues follow up with Dr. Bernardo Heater (urologist) and has good response to the Sildenafil prn. Denies side effects.  6. Multiple actinic keratoses Followed by Dr. Evorn Gong (dermatologist) and presently on Fluorouracil-Calcipotriene Cream therapy.

## 2017-01-26 ENCOUNTER — Other Ambulatory Visit: Payer: Self-pay | Admitting: Family Medicine

## 2017-01-29 ENCOUNTER — Telehealth: Payer: Self-pay | Admitting: Urology

## 2017-01-29 NOTE — Telephone Encounter (Signed)
Patient is calling and asking for a refill on generic Avodart. He said his mail order pharmacy [facility] called [place] and requested this? I didn't see anything in the chart can you check and call him back? He said if he is not there it is ok to speak with his wife.   Sharyn Lull

## 2017-02-04 ENCOUNTER — Other Ambulatory Visit: Payer: Self-pay | Admitting: Urology

## 2017-02-04 MED ORDER — DUTASTERIDE 0.5 MG PO CAPS: 0.5000 mg | ORAL_CAPSULE | Freq: Every day | ORAL | 2 refills | Status: DC

## 2017-02-04 NOTE — Telephone Encounter (Signed)
Medication has been called in.  

## 2017-02-23 DIAGNOSIS — Z08 Encounter for follow-up examination after completed treatment for malignant neoplasm: Secondary | ICD-10-CM | POA: Diagnosis not present

## 2017-02-23 DIAGNOSIS — C44629 Squamous cell carcinoma of skin of left upper limb, including shoulder: Secondary | ICD-10-CM | POA: Diagnosis not present

## 2017-02-23 DIAGNOSIS — Z85828 Personal history of other malignant neoplasm of skin: Secondary | ICD-10-CM | POA: Diagnosis not present

## 2017-02-23 DIAGNOSIS — D485 Neoplasm of uncertain behavior of skin: Secondary | ICD-10-CM | POA: Diagnosis not present

## 2017-02-23 DIAGNOSIS — L570 Actinic keratosis: Secondary | ICD-10-CM | POA: Diagnosis not present

## 2017-02-23 DIAGNOSIS — L821 Other seborrheic keratosis: Secondary | ICD-10-CM | POA: Diagnosis not present

## 2017-02-26 ENCOUNTER — Telehealth: Payer: Self-pay

## 2017-02-26 NOTE — Telephone Encounter (Signed)
Spoke with wife to see about scheduling pts AWV with myself. Wife declined this visit at this time and states her husband would call back if interested in having this completed in the future.  -MM

## 2017-03-02 ENCOUNTER — Telehealth: Payer: Self-pay

## 2017-03-02 NOTE — Telephone Encounter (Signed)
Wife called because she saw losartan-hctz has been recalled. Please advise.

## 2017-03-02 NOTE — Telephone Encounter (Signed)
Patient's wife Peggy advised.  

## 2017-03-02 NOTE — Telephone Encounter (Signed)
Have him check with the pharmacist to see if his Losartan-HCTZ was in the lot that had the [place] contaminant. If it was, should schedule follow up appointment.

## 2017-03-12 ENCOUNTER — Encounter: Payer: Self-pay | Admitting: Family Medicine

## 2017-03-12 ENCOUNTER — Ambulatory Visit (INDEPENDENT_AMBULATORY_CARE_PROVIDER_SITE_OTHER): Payer: Medicare HMO | Admitting: Family Medicine

## 2017-03-12 VITALS — BP 122/72 | HR 61 | Temp 97.8°F | Wt 174.0 lb

## 2017-03-12 DIAGNOSIS — E7800 Pure hypercholesterolemia, unspecified: Secondary | ICD-10-CM | POA: Diagnosis not present

## 2017-03-12 DIAGNOSIS — I10 Essential (primary) hypertension: Secondary | ICD-10-CM

## 2017-03-12 NOTE — Progress Notes (Signed)
Patient: Michael [name] Male    DOB: 12/05/1945   72 y.o.   MRN: [id number] Visit Date: 03/12/2017  Today's Provider: Vernie Murders, PA   Chief Complaint  Patient presents with  . Hypertension  . Hyperlipidemia  . Follow-up   Subjective:    HPI  Hypertension, follow-up:     BP Readings from Last 3 Encounters:  12/09/16 122/78  05/30/16 140/72  04/29/16 128/72    Hewas last seen for hypertension 3 monthsago.  BP at that visit was 122/78. Management since that visit includes continue medications, follow low fat diet, restrict salt intake, and exercise 30 mins 3-4 days per week. Hereports goodcompliance with treatment. Heis nothaving side effects.  Heisexercising. Heisadherent to low salt diet.  Outside blood pressures are being checked. Heis experiencing none.  Patient denies chest pain, irregular heart beat and palpitations.  Cardiovascular risk factors include advanced age (older than 33 for men, 57 for women), dyslipidemia, hypertension and male gender.  Use of agents associated with hypertension: none.   ------------------------------------------------------------------------   Lipid/Cholesterol, Follow-up:             Last seen for this 3 monthsago.  Management since that visit includes continue medications, follow low fat diet, exercise 30 mins 3-4 days per week.  Last Lipid Panel:  RecentLabs   Lab Results  Component Value Date   CHOL 172 12/09/2016   HDL 49 12/09/2016   LDLCALC 110 (H) 02/01/2016   TRIG 73 12/09/2016   CHOLHDL 3.5 12/09/2016   Hereports goodcompliance with treatment. Heis nothaving side effects.      Wt Readings from Last 3 Encounters:  12/09/16 176 lb 3.2 oz (79.9 kg)  05/30/16 179 lb 9.6 oz (81.5 kg)  04/29/16 181 lb 6.4 oz (82.3 kg)   History reviewed. No pertinent past medical history. Patient Active Problem List   Diagnosis Date Noted  . Low back ache 11/16/2014  . Anxiety 08/21/2014    . Abnormal finding on thyroid function test 08/21/2014  . Benign fibroma of prostate 08/21/2014  . ED (erectile dysfunction) of organic origin 08/21/2014  . Exposure to viral hepatitis 08/21/2014  . Hypercholesteremia 08/21/2014  . Benign hypertension 08/21/2014  . Severe obstructive sleep apnea 08/21/2014  . Sleep disturbance 08/21/2014  . Abnormal prostate specific antigen 10/21/2011  . Benign prostatic hyperplasia with urinary obstruction 10/21/2011  . Elevated prostate specific antigen (PSA) 10/21/2011   Past Surgical History:  Procedure Laterality Date  . AMPUTATION FINGER Left 1965   5TH DIGIT, INDUSTRIAL ACCIDENT  . PROSTATE BIOPSY  03/1998   NEGATIVE   Family History  Problem Relation Age of Onset  . Emphysema Mother   . Diabetes Maternal Uncle    Allergies  Allergen Reactions  . Penicillins   . Sulfa Antibiotics   . Sulfamethoxazole-Trimethoprim     Current Outpatient Medications:  .  aspirin 81 MG tablet, Take 1 tablet by mouth daily., Disp: , Rfl:  .  atorvastatin (LIPITOR) 40 MG tablet, TAKE 1 TABLET AT BEDTIME, Disp: 90 tablet, Rfl: 3 .  diazepam (VALIUM) 5 MG tablet, Take 1 tablet (5 mg total) by mouth every 8 (eight) hours as needed for anxiety., Disp: 12 tablet, Rfl: 0 .  dutasteride (AVODART) 0.5 MG capsule, Take 1 capsule (0.5 mg total) by mouth daily., Disp: 90 capsule, Rfl: 2 .  flurbiprofen (ANSAID) 100 MG tablet, Take 1 tablet (100 mg total) by mouth 2 (two) times daily. As needed for back pain., Disp: 180 tablet,  Rfl: 1 .  losartan-hydrochlorothiazide (HYZAAR) 50-12.5 MG tablet, TAKE 1 TABLET EVERY DAY, Disp: 90 tablet, Rfl: 3 .  Melatonin (RA MELATONIN) 10 MG TABS, Take by mouth., Disp: , Rfl:  .  Omega-3 1000 MG CAPS, Take by mouth., Disp: , Rfl:  .  sildenafil (REVATIO) 20 MG tablet, Take 1 tablet by mouth daily., Disp: , Rfl:  .  traZODone (DESYREL) 100 MG tablet, TAKE 1 TABLET EVERY DAY, Disp: 90 tablet, Rfl: 3  Review of Systems   Constitutional: Negative.   Respiratory: Negative.   Cardiovascular: Negative.   Musculoskeletal: Negative.     Social History   Tobacco Use  . Smoking status: Never Smoker  . Smokeless tobacco: Never Used  Substance Use Topics  . Alcohol use: No    Alcohol/week: 0.0 oz   Objective:   BP 122/72 (BP Location: Right Arm, Patient Position: Sitting, Cuff Size: Normal)   Pulse 61   Temp 97.8 F (36.6 C) (Oral)   Wt 174 lb (78.9 kg)   SpO2 98%   BMI 26.46 kg/m   Physical Exam  Constitutional: He is oriented to person, place, and time. He appears well-developed and well-nourished. No distress.  HENT:  Head: Normocephalic and atraumatic.  Right Ear: Hearing normal.  Left Ear: Hearing normal.  Nose: Nose normal.  Eyes: Conjunctivae and lids are normal. Right eye exhibits no discharge. Left eye exhibits no discharge. No scleral icterus.  Neck: Neck supple.  Cardiovascular: Normal rate.  Pulmonary/Chest: Effort normal and breath sounds normal. No respiratory distress.  Abdominal: Soft. Bowel sounds are normal.  Musculoskeletal: Normal range of motion.  Neurological: He is alert and oriented to person, place, and time.  Skin: Skin is intact. No lesion and no rash noted.  Psychiatric: He has a normal mood and affect. His speech is normal and behavior is normal. Thought content normal.      Assessment & Plan:     1. Benign hypertension Well controlled and tolerating Hyzaar without side effects. No chest pain, dyspnea or edema. Check routine labs and continue present dosage. Recheck pending reports. - CBC with Differential/Platelet - Comprehensive metabolic panel  2. Hypercholesteremia Has stopped eating ice cream and has lost 2 more lbs since last office visit. Continue Atorvastatin without side effects. Recheck labs. - Lipid panel       Vernie Murders, PA  [facility]

## 2017-03-13 LAB — CBC WITH DIFFERENTIAL/PLATELET
Basophils Absolute: 0 10*3/uL (ref 0.0–0.2)
Basos: 0 %
EOS (ABSOLUTE): 0.3 10*3/uL (ref 0.0–0.4)
Eos: 6 %
HEMOGLOBIN: 16.3 g/dL (ref 13.0–17.7)
Hematocrit: 46.4 % (ref 37.5–51.0)
IMMATURE GRANS (ABS): 0 10*3/uL (ref 0.0–0.1)
IMMATURE GRANULOCYTES: 0 %
LYMPHS: 35 %
Lymphocytes Absolute: 2 10*3/uL (ref 0.7–3.1)
MCH: 32.1 pg (ref 26.6–33.0)
MCHC: 35.1 g/dL (ref 31.5–35.7)
MCV: 92 fL (ref 79–97)
MONOCYTES: 8 %
Monocytes Absolute: 0.4 10*3/uL (ref 0.1–0.9)
NEUTROS ABS: 2.9 10*3/uL (ref 1.4–7.0)
NEUTROS PCT: 51 %
Platelets: 220 10*3/uL (ref 150–379)
RBC: 5.07 x10E6/uL (ref 4.14–5.80)
RDW: 12.9 % (ref 12.3–15.4)
WBC: 5.6 10*3/uL (ref 3.4–10.8)

## 2017-03-13 LAB — COMPREHENSIVE METABOLIC PANEL
ALBUMIN: 4.5 g/dL (ref 3.6–4.8)
ALT: 17 IU/L (ref 0–44)
AST: 20 IU/L (ref 0–40)
Albumin/Globulin Ratio: 2.1 (ref 1.2–2.2)
Alkaline Phosphatase: 66 IU/L (ref 39–117)
BUN / CREAT RATIO: 14 (ref 10–24)
BUN: 15 mg/dL (ref 8–27)
Bilirubin Total: 0.7 mg/dL (ref 0.0–1.2)
CALCIUM: 9.7 mg/dL (ref 8.6–10.2)
CO2: 27 mmol/L (ref 20–29)
CREATININE: 1.09 mg/dL (ref 0.76–1.27)
Chloride: 101 mmol/L (ref 96–106)
GFR calc non Af Amer: 69 mL/min/{1.73_m2} (ref >59)
GFR, EST AFRICAN AMERICAN: 80 mL/min/{1.73_m2} (ref >59)
GLUCOSE: 88 mg/dL (ref 65–99)
Globulin, Total: 2.1 g/dL (ref 1.5–4.5)
Potassium: 4.1 mmol/L (ref 3.5–5.2)
Sodium: 142 mmol/L (ref 134–144)
TOTAL PROTEIN: 6.6 g/dL (ref 6.0–8.5)

## 2017-03-13 LAB — LIPID PANEL
Chol/HDL Ratio: 3.8 ratio (ref 0.0–5.0)
Cholesterol, Total: 137 mg/dL (ref 100–199)
HDL: 36 mg/dL — ABNORMAL LOW (ref >39)
LDL Calculated: 89 mg/dL (ref 0–99)
Triglycerides: 62 mg/dL (ref 0–149)
VLDL CHOLESTEROL CAL: 12 mg/dL (ref 5–40)

## 2017-03-27 ENCOUNTER — Telehealth: Payer: Self-pay | Admitting: Family Medicine

## 2017-04-03 DIAGNOSIS — C44629 Squamous cell carcinoma of skin of left upper limb, including shoulder: Secondary | ICD-10-CM | POA: Diagnosis not present

## 2017-04-03 DIAGNOSIS — D485 Neoplasm of uncertain behavior of skin: Secondary | ICD-10-CM | POA: Diagnosis not present

## 2017-04-03 DIAGNOSIS — L570 Actinic keratosis: Secondary | ICD-10-CM | POA: Diagnosis not present

## 2017-04-17 DIAGNOSIS — L570 Actinic keratosis: Secondary | ICD-10-CM | POA: Diagnosis not present

## 2017-05-11 ENCOUNTER — Telehealth: Payer: Self-pay | Admitting: Urology

## 2017-05-11 NOTE — Telephone Encounter (Signed)
Pt LMOM asking for a refill of medication, pt didn't leave specific name etc. States he is about out of his medication and needs a refill called into [facility]. Pt did not leave phone #.  Please Advise.  Thanks.

## 2017-05-12 MED ORDER — SILDENAFIL CITRATE 20 MG PO TABS: 20.0000 mg | ORAL_TABLET | Freq: Every day | ORAL | 2 refills | Status: DC

## 2017-05-12 NOTE — Telephone Encounter (Signed)
Rx sent to [facility] as requested. 

## 2017-05-12 NOTE — Telephone Encounter (Signed)
Patient is calling again today stating this is his 3rd phone call and no one has called him back. He is wanting a refill in sildenafil called in to his [facility] mail order pharmacy.  Please advise  Thanks  Sharyn Lull

## 2017-05-26 ENCOUNTER — Telehealth: Payer: Self-pay | Admitting: Urology

## 2017-05-26 NOTE — Telephone Encounter (Signed)
Pt came by today and stated he has called several times.  The pharmacy has the Rx (sildenafil 20 mg), but there are NO directions as to how he should take meds.  PLEASE call pharmacy at [phone].

## 2017-05-28 NOTE — Telephone Encounter (Signed)
complete

## 2017-05-29 MED ORDER — SILDENAFIL CITRATE 20 MG PO TABS: 20.0000 mg | ORAL_TABLET | Freq: Every day | ORAL | 2 refills | Status: DC

## 2017-05-29 NOTE — Telephone Encounter (Signed)
Pt has called and left several messages but never left his name or DOB. Therefore medication could not be fixed. Directions sent to [facility] home delivery.

## 2017-06-02 ENCOUNTER — Other Ambulatory Visit: Payer: Self-pay

## 2017-06-02 MED ORDER — SILDENAFIL CITRATE 20 MG PO TABS: 20.0000 mg | ORAL_TABLET | Freq: Every day | ORAL | 2 refills | Status: DC

## 2017-06-04 ENCOUNTER — Telehealth: Payer: Self-pay | Admitting: Urology

## 2017-06-04 NOTE — Telephone Encounter (Signed)
Blase Mess has been removed and Medicap has been removed as well out of [facility].

## 2017-06-04 NOTE — Telephone Encounter (Signed)
PLEASE TAKE [facility] AND MEDICAP OFF HIS SNAPSHOT!

## 2017-06-04 NOTE — Telephone Encounter (Signed)
PT DOES NOT HAVE [facility]!  PLEASE DO NOT SEND RX THERE!

## 2017-06-04 NOTE — Telephone Encounter (Signed)
Medicap is not in pt chart. I dont know how to get the [place] Rx out of chart. Can you help?

## 2017-07-10 ENCOUNTER — Other Ambulatory Visit: Payer: Self-pay | Admitting: Family Medicine

## 2017-07-10 DIAGNOSIS — R972 Elevated prostate specific antigen [PSA]: Secondary | ICD-10-CM

## 2017-07-13 ENCOUNTER — Ambulatory Visit: Payer: Medicare HMO | Admitting: Urology

## 2017-07-13 ENCOUNTER — Other Ambulatory Visit: Payer: Medicare HMO

## 2017-07-13 DIAGNOSIS — R972 Elevated prostate specific antigen [PSA]: Secondary | ICD-10-CM | POA: Diagnosis not present

## 2017-07-14 LAB — PSA: Prostate Specific Ag, Serum: 2 ng/mL (ref 0.0–4.0)

## 2017-07-16 ENCOUNTER — Encounter: Payer: Self-pay | Admitting: Urology

## 2017-07-16 ENCOUNTER — Ambulatory Visit: Payer: Medicare HMO | Admitting: Urology

## 2017-07-16 VITALS — BP 163/82 | HR 64 | Ht 68.0 in | Wt 170.6 lb

## 2017-07-16 DIAGNOSIS — N5201 Erectile dysfunction due to arterial insufficiency: Secondary | ICD-10-CM | POA: Diagnosis not present

## 2017-07-16 DIAGNOSIS — N400 Enlarged prostate without lower urinary tract symptoms: Secondary | ICD-10-CM | POA: Diagnosis not present

## 2017-07-16 DIAGNOSIS — R972 Elevated prostate specific antigen [PSA]: Secondary | ICD-10-CM

## 2017-07-16 MED ORDER — SILDENAFIL CITRATE 20 MG PO TABS: ORAL_TABLET | ORAL | 0 refills | Status: DC

## 2017-07-16 MED ORDER — DUTASTERIDE 0.5 MG PO CAPS: 0.5000 mg | ORAL_CAPSULE | Freq: Every day | ORAL | 1 refills | Status: DC

## 2017-07-16 NOTE — Progress Notes (Signed)
07/16/2017 9:47 AM   Michael [name] 1945/10/10 [id number]  Referring provider: Margo Common, [place], [place] [address]  Chief Complaint  Patient presents with  . Elevated PSA  . Erectile Dysfunction   Urologic problem list: -Elevated PSA; prostate biopsy 2000 for PSA of 4.6 with benign pathology  -BPH with lower urinary tract symptoms; on dutasteride 3 times weekly  -Erectile dysfunction; generic sildenafil at 63 mg 72 year old male   HPI: 72 year old male presents for follow-up of the above problems.  He states he is doing quite well.  He has no bothersome lower urinary tract symptoms.  He denies dysuria or gross hematuria.  Denies flank, abdominal, pelvic or scrotal pain.  A PSA performed last week was stable at 2.0 (uncorrected).  He remains on generic sildenafil which is effective at 100 mg.   PMH: History reviewed. No pertinent past medical history.  Surgical History: Past Surgical History:  Procedure Laterality Date  . AMPUTATION FINGER Left 1965   5TH DIGIT, INDUSTRIAL ACCIDENT  . PROSTATE BIOPSY  03/1998   NEGATIVE    Home Medications:  Allergies as of 07/16/2017      Reactions   Penicillins    Sulfa Antibiotics    Sulfamethoxazole-trimethoprim       Medication List        Accurate as of 07/16/17  9:47 AM. Always use your most recent med list.          aspirin 81 MG EC tablet Take by mouth.   atorvastatin 40 MG tablet Commonly known as:  LIPITOR TAKE 1 TABLET AT BEDTIME   diazepam 5 MG tablet Commonly known as:  VALIUM Take 1 tablet (5 mg total) by mouth every 8 (eight) hours as needed for anxiety.   dutasteride 0.5 MG capsule Commonly known as:  AVODART Take 1 capsule (0.5 mg total) by mouth daily.   EFFERDENT DENTURE CLEANSER Tbef   flurbiprofen 100 MG tablet Commonly known as:  ANSAID Take 1 tablet (100 mg total) by mouth 2 (two) times daily. As needed for back pain.   losartan-hydrochlorothiazide  50-12.5 MG tablet Commonly known as:  HYZAAR TAKE 1 TABLET EVERY DAY   Magnesium 250 MG Tabs Take by mouth.   Omega-3 1000 MG Caps Take by mouth.   RA MELATONIN 10 MG Tabs Generic drug:  Melatonin Take by mouth.   RANITIDINE 75 PO   sildenafil 20 MG tablet Commonly known as:  REVATIO Take 1 tablet (20 mg total) by mouth daily. Take 1-5 tablets as needed 1 hour prior to intercourse   traZODone 100 MG tablet Commonly known as:  DESYREL TAKE 1 TABLET EVERY DAY   vitamin C 1000 MG tablet Take by mouth.       Allergies:  Allergies  Allergen Reactions  . Penicillins   . Sulfa Antibiotics   . Sulfamethoxazole-Trimethoprim     Family History: Family History  Problem Relation Age of Onset  . Emphysema Mother   . Diabetes Maternal Uncle     Social History:  reports that he has never smoked. He has never used smokeless tobacco. He reports that he does not drink alcohol or use drugs.  ROS: UROLOGY Frequent Urination?: No Hard to postpone urination?: No Burning/pain with urination?: No Get up at night to urinate?: No Leakage of urine?: No Urine stream starts and stops?: No Trouble starting stream?: No Do you have to strain to urinate?: No Blood in urine?: No Urinary tract infection?: No Sexually transmitted disease?:  No Injury to kidneys or bladder?: No Painful intercourse?: No Weak stream?: No Erection problems?: No Penile pain?: No  Gastrointestinal Nausea?: No Vomiting?: No Indigestion/heartburn?: No Diarrhea?: No Constipation?: No  Constitutional Fever: No Night sweats?: No Weight loss?: No Fatigue?: No  Skin Skin rash/lesions?: No Itching?: No  Eyes Blurred vision?: No Double vision?: No  Ears/Nose/Throat Sore throat?: No Sinus problems?: No  Hematologic/Lymphatic Swollen glands?: No Easy bruising?: No  Cardiovascular Leg swelling?: No Chest pain?: No  Respiratory Cough?: No Shortness of breath?: No  Endocrine Excessive  thirst?: No  Musculoskeletal Back pain?: No Joint pain?: No  Neurological Headaches?: No Dizziness?: No  Psychologic Depression?: No Anxiety?: No  Physical Exam: BP (!) 163/82 (BP Location: Left Arm, Patient Position: Sitting, Cuff Size: Normal)   Pulse 64   Ht 5\' 8"  (1.727 m)   Wt 170 lb 9.6 oz (77.4 kg)   SpO2 99%   BMI 25.94 kg/m   Constitutional:  Alert and oriented, No acute distress. HEENT: [place] AT, moist mucus membranes.  Trachea midline, no masses. Cardiovascular: No clubbing, cyanosis, or edema. Respiratory: Normal respiratory effort, no increased work of breathing. GI: Abdomen is soft, nontender, nondistended, no abdominal masses GU: No CVA tenderness.  Prostate 40 g, smooth without nodules Lymph: No cervical or inguinal lymphadenopathy. Skin: No rashes, bruises or suspicious lesions. Neurologic: Grossly intact, no focal deficits, moving all 4 extremities. Psychiatric: Normal mood and affect.  Laboratory Data: Lab Results  Component Value Date   WBC 5.6 03/12/2017   HGB 16.3 03/12/2017   HCT 46.4 03/12/2017   MCV 92 03/12/2017   PLT 220 03/12/2017    Lab Results  Component Value Date   CREATININE 1.09 03/12/2017     Assessment & Plan:   72 year old male with history of elevated PSA and benign prostate biopsy.  He is doing well on dutasteride which was refilled.  Generic sildenafil was also refilled.  He will continue annual follow-up.   Abbie Sons, [place] [address] [place], [place] [address] [phone]

## 2017-08-14 DIAGNOSIS — X32XXXA Exposure to sunlight, initial encounter: Secondary | ICD-10-CM | POA: Diagnosis not present

## 2017-08-14 DIAGNOSIS — Z85828 Personal history of other malignant neoplasm of skin: Secondary | ICD-10-CM | POA: Diagnosis not present

## 2017-08-14 DIAGNOSIS — D485 Neoplasm of uncertain behavior of skin: Secondary | ICD-10-CM | POA: Diagnosis not present

## 2017-08-14 DIAGNOSIS — S20469A Insect bite (nonvenomous) of unspecified back wall of thorax, initial encounter: Secondary | ICD-10-CM | POA: Diagnosis not present

## 2017-08-14 DIAGNOSIS — Z08 Encounter for follow-up examination after completed treatment for malignant neoplasm: Secondary | ICD-10-CM | POA: Diagnosis not present

## 2017-08-14 DIAGNOSIS — L570 Actinic keratosis: Secondary | ICD-10-CM | POA: Diagnosis not present

## 2017-08-26 DIAGNOSIS — L570 Actinic keratosis: Secondary | ICD-10-CM | POA: Diagnosis not present

## 2017-08-26 DIAGNOSIS — X32XXXA Exposure to sunlight, initial encounter: Secondary | ICD-10-CM | POA: Diagnosis not present

## 2017-09-14 ENCOUNTER — Ambulatory Visit: Payer: Medicare HMO | Admitting: Family Medicine

## 2017-09-28 ENCOUNTER — Ambulatory Visit: Payer: Medicare HMO | Admitting: Family Medicine

## 2017-10-23 ENCOUNTER — Telehealth: Payer: Self-pay | Admitting: Urology

## 2017-10-23 ENCOUNTER — Other Ambulatory Visit: Payer: Self-pay

## 2017-10-23 DIAGNOSIS — N5201 Erectile dysfunction due to arterial insufficiency: Secondary | ICD-10-CM

## 2017-10-23 MED ORDER — SILDENAFIL CITRATE 20 MG PO TABS: ORAL_TABLET | ORAL | 3 refills | Status: DC

## 2017-10-23 NOTE — Telephone Encounter (Signed)
Incoming request from [facility] for sildenafil, pt seen within the last year. RX filled.

## 2017-10-23 NOTE — Telephone Encounter (Signed)
Left message informing pt we need to know medication name as there are not notes on this matter.

## 2017-10-23 NOTE — Telephone Encounter (Signed)
Pt lmom stating that he called on Monday asking for a refill of medication, pt did not name or mention the medication needed, pt was advised that the medication would be sent in and to contact his pharmacy, pt states that he has contacted his pharmacy, who also states they have contacted our office for refill on behalf of patient, pt states the pharmacy still hasn't received anything from our office and wants to know what to do next. Please advise. Thanks.

## 2017-10-27 ENCOUNTER — Encounter: Payer: Self-pay | Admitting: Family Medicine

## 2017-10-27 ENCOUNTER — Ambulatory Visit (INDEPENDENT_AMBULATORY_CARE_PROVIDER_SITE_OTHER): Payer: Medicare HMO | Admitting: Family Medicine

## 2017-10-27 VITALS — BP 154/80 | HR 64 | Temp 97.5°F | Wt 175.0 lb

## 2017-10-27 DIAGNOSIS — I10 Essential (primary) hypertension: Secondary | ICD-10-CM | POA: Diagnosis not present

## 2017-10-27 DIAGNOSIS — N400 Enlarged prostate without lower urinary tract symptoms: Secondary | ICD-10-CM

## 2017-10-27 DIAGNOSIS — M545 Low back pain, unspecified: Secondary | ICD-10-CM

## 2017-10-27 DIAGNOSIS — N529 Male erectile dysfunction, unspecified: Secondary | ICD-10-CM | POA: Diagnosis not present

## 2017-10-27 DIAGNOSIS — E7800 Pure hypercholesterolemia, unspecified: Secondary | ICD-10-CM

## 2017-10-27 DIAGNOSIS — F419 Anxiety disorder, unspecified: Secondary | ICD-10-CM

## 2017-10-27 MED ORDER — TRAZODONE HCL 100 MG PO TABS: 200.0000 mg | ORAL_TABLET | Freq: Every day | ORAL | 3 refills | Status: DC

## 2017-10-27 MED ORDER — FLURBIPROFEN 100 MG PO TABS: 100.0000 mg | ORAL_TABLET | Freq: Two times a day (BID) | ORAL | 1 refills | Status: DC

## 2017-10-27 NOTE — Progress Notes (Signed)
Patient: Michael [name] Male    DOB: [birth year]   72 y.o.   MRN: [id number] Visit Date: 10/27/2017  Today's Provider: Vernie Murders, PA   Chief Complaint  Patient presents with  . Hypertension  . Hyperlipidemia   Subjective:    Hypertension  This is a chronic problem. The problem is unchanged. Pertinent negatives include no anxiety, blurred vision, chest pain, headaches, malaise/fatigue, neck pain, orthopnea, palpitations, peripheral edema, PND, shortness of breath or sweats. Risk factors for coronary artery disease include dyslipidemia and male gender. There are no compliance problems.   Hyperlipidemia  This is a chronic problem. The problem is controlled. Recent lipid tests were reviewed and are normal. Pertinent negatives include no chest pain, focal sensory loss, focal weakness, leg pain, myalgias or shortness of breath. Current antihyperlipidemic treatment includes statins. There are no compliance problems.    Lab Results  Component Value Date   CHOL 137 03/12/2017   CHOL 172 12/09/2016   CHOL 168 02/01/2016   Lab Results  Component Value Date   HDL 36 (L) 03/12/2017   HDL 49 12/09/2016   HDL 41 02/01/2016   Lab Results  Component Value Date   LDLCALC 89 03/12/2017   LDLCALC 107 (H) 12/09/2016   LDLCALC 110 (H) 02/01/2016   Lab Results  Component Value Date   TRIG 62 03/12/2017   TRIG 73 12/09/2016   TRIG 84 02/01/2016   Lab Results  Component Value Date   CHOLHDL 3.8 03/12/2017   CHOLHDL 3.5 12/09/2016   CHOLHDL 4.1 02/01/2016   BP Readings from Last 3 Encounters:  10/27/17 (!) 154/80  07/16/17 (!) 163/82  03/12/17 122/72      Patient Active Problem List   Diagnosis Date Noted  . Erectile dysfunction due to arterial insufficiency 07/16/2017  . Low back ache 11/16/2014  . Anxiety 08/21/2014  . Abnormal finding on thyroid function test 08/21/2014  . BPH without obstruction/lower urinary tract symptoms 08/21/2014  . ED (erectile dysfunction)  of organic origin 08/21/2014  . Exposure to viral hepatitis 08/21/2014  . Hypercholesteremia 08/21/2014  . Benign hypertension 08/21/2014  . Severe obstructive sleep apnea 08/21/2014  . Sleep disturbance 08/21/2014  . Abnormal prostate specific antigen 10/21/2011  . Benign prostatic hyperplasia with urinary obstruction 10/21/2011  . Elevated prostate specific antigen (PSA) 10/21/2011   Past Surgical History:  Procedure Laterality Date  . AMPUTATION FINGER Left 1965   5TH DIGIT, INDUSTRIAL ACCIDENT  . PROSTATE BIOPSY  03/1998   NEGATIVE   Family History  Problem Relation Age of Onset  . Emphysema Mother   . Diabetes Maternal Uncle    Allergies  Allergen Reactions  . Penicillins   . Sulfa Antibiotics   . Sulfamethoxazole-Trimethoprim     Current Outpatient Medications:  .  Ascorbic Acid (VITAMIN C) 1000 MG tablet, Take by mouth., Disp: , Rfl:  .  aspirin 81 MG EC tablet, Take by mouth., Disp: , Rfl:  .  Denture Care Products (EFFERDENT DENTURE CLEANSER) TBEF, , Disp: , Rfl:  .  dutasteride (AVODART) 0.5 MG capsule, Take 1 capsule (0.5 mg total) by mouth daily., Disp: 90 capsule, Rfl: 1 .  flurbiprofen (ANSAID) 100 MG tablet, Take 1 tablet (100 mg total) by mouth 2 (two) times daily. As needed for back pain., Disp: 180 tablet, Rfl: 1 .  losartan-hydrochlorothiazide (HYZAAR) 50-12.5 MG tablet, TAKE 1 TABLET EVERY DAY, Disp: 90 tablet, Rfl: 3 .  Magnesium 250 MG TABS, Take by mouth., Disp: ,  Rfl:  .  Melatonin (RA MELATONIN) 10 MG TABS, Take by mouth., Disp: , Rfl:  .  Omega-3 1000 MG CAPS, Take by mouth., Disp: , Rfl:  .  raNITIdine HCl (RANITIDINE 75 PO), , Disp: , Rfl:  .  sildenafil (REVATIO) 20 MG tablet, Take 1-5 tablets as needed 1 hour prior to intercourse, Disp: 90 tablet, Rfl: 3 .  traZODone (DESYREL) 100 MG tablet, TAKE 1 TABLET EVERY DAY, Disp: 90 tablet, Rfl: 3 .  atorvastatin (LIPITOR) 40 MG tablet, TAKE 1 TABLET AT BEDTIME, Disp: 90 tablet, Rfl: 3 .  diazepam  (VALIUM) 5 MG tablet, Take 1 tablet (5 mg total) by mouth every 8 (eight) hours as needed for anxiety., Disp: 12 tablet, Rfl: 0  Review of Systems  Constitutional: Negative.  Negative for malaise/fatigue.  Eyes: Negative for blurred vision.  Respiratory: Negative.  Negative for shortness of breath.   Cardiovascular: Negative.  Negative for chest pain, palpitations, orthopnea and PND.  Gastrointestinal: Negative.   Musculoskeletal: Positive for back pain (Occasional low back pain.  ). Negative for arthralgias, gait problem, joint swelling, myalgias, neck pain and neck stiffness.  Neurological: Negative for dizziness, focal weakness, light-headedness and headaches.    Social History   Tobacco Use  . Smoking status: Never Smoker  . Smokeless tobacco: Never Used  Substance Use Topics  . Alcohol use: No    Alcohol/week: 0.0 standard drinks   Objective:   BP (!) 154/80 (BP Location: Right Arm, Patient Position: Sitting, Cuff Size: Normal)   Pulse 64   Temp (!) 97.5 F (36.4 C) (Oral)   Wt 175 lb (79.4 kg)   SpO2 97%   BMI 26.61 kg/m  Vitals:   10/27/17 0822  BP: (!) 154/80  Pulse: 64  Temp: (!) 97.5 F (36.4 C)  TempSrc: Oral  SpO2: 97%  Weight: 175 lb (79.4 kg)   Physical Exam  Constitutional: He is oriented to person, place, and time. He appears well-developed and well-nourished. No distress.  HENT:  Head: Normocephalic and atraumatic.  Right Ear: Hearing normal.  Left Ear: Hearing normal.  Nose: Nose normal.  Eyes: Conjunctivae and lids are normal. Right eye exhibits no discharge. Left eye exhibits no discharge. No scleral icterus.  Neck: Normal range of motion. Neck supple.  Cardiovascular: Normal rate and regular rhythm.  Pulmonary/Chest: Effort normal and breath sounds normal. No respiratory distress.  Abdominal: Soft. Bowel sounds are normal.  Musculoskeletal: Normal range of motion.  Neurological: He is alert and oriented to person, place, and time.  Sleeping  well with use of Trazodone.  Skin: Skin is intact. No lesion and no rash noted.  Psychiatric: His speech is normal and behavior is normal. Thought content normal. His mood appears anxious.      Assessment & Plan:     1. Benign hypertension Fair control. Better readings at home (around 118-130/70-80). Still taking the Hyzaar 50-12.5 mg qd without side effects. No muscle cramps, palpitations, dyspnea or edema. Recheck labs and follow up pending reports. Declines flu shot and medicare wellness screening. - CBC with Differential/Platelet - Comprehensive metabolic panel  2. BPH without obstruction/lower urinary tract symptoms No nocturia. Still taking the Avodart 0.5 mg qd and followed by urologist (Dr. Bernardo Heater).  3. ED (erectile dysfunction) of organic origin Getting fair results from Sildenafil prescribed by Dr. Bernardo Heater (Urologist). No chest pains or palpitations.  4. Anxiety Feeling stable without panic attacks. Sleeping well after increasing the Trazodone to 200 mg qd. No longer needing any  Diazepam. Refilled prescription and getting routine labs today. - traZODone (DESYREL) 100 MG tablet; Take 2 tablets (200 mg total) by mouth daily.  Dispense: 180 tablet; Refill: 3  5. Hypercholesteremia Tolerating Atorvastatin 40 mg qd without side effects. Has cut out night time ice cream and lowered other fats in diet. Recheck labs. Stopped taking the Omega-3 Fish Oil.  - Comprehensive metabolic panel - Lipid panel  6. Midline low back pain without sciatica, unspecified chronicity Intermittent ache and stiffness. Suspected degenerative disease. Ansaid always works well and rarely has to use it now. Continues to work in his yard frequently and plans to walk some on a track for exercise. Refilled Ansaid for prn use. - flurbiprofen (ANSAID) 100 MG tablet; Take 1 tablet (100 mg total) by mouth 2 (two) times daily. As needed for back pain.  Dispense: 180 tablet; Refill: [place]

## 2017-10-28 LAB — COMPREHENSIVE METABOLIC PANEL
ALBUMIN: 4.3 g/dL (ref 3.6–4.8)
ALK PHOS: 59 IU/L (ref 39–117)
ALT: 17 IU/L (ref 0–44)
AST: 18 IU/L (ref 0–40)
Albumin/Globulin Ratio: 2.3 — ABNORMAL HIGH (ref 1.2–2.2)
BUN / CREAT RATIO: 19 (ref 10–24)
BUN: 20 mg/dL (ref 8–27)
Bilirubin Total: 0.4 mg/dL (ref 0.0–1.2)
CALCIUM: 9.3 mg/dL (ref 8.6–10.2)
CO2: 26 mmol/L (ref 20–29)
CREATININE: 1.05 mg/dL (ref 0.76–1.27)
Chloride: 102 mmol/L (ref 96–106)
GFR, EST AFRICAN AMERICAN: 83 mL/min/{1.73_m2} (ref >59)
GFR, EST NON AFRICAN AMERICAN: 72 mL/min/{1.73_m2} (ref >59)
GLOBULIN, TOTAL: 1.9 g/dL (ref 1.5–4.5)
GLUCOSE: 99 mg/dL (ref 65–99)
Potassium: 4.1 mmol/L (ref 3.5–5.2)
SODIUM: 142 mmol/L (ref 134–144)
TOTAL PROTEIN: 6.2 g/dL (ref 6.0–8.5)

## 2017-10-28 LAB — CBC WITH DIFFERENTIAL/PLATELET
Basophils Absolute: 0 10*3/uL (ref 0.0–0.2)
Basos: 0 %
EOS (ABSOLUTE): 0.1 10*3/uL (ref 0.0–0.4)
EOS: 2 %
Hematocrit: 44.8 % (ref 37.5–51.0)
Hemoglobin: 15.3 g/dL (ref 13.0–17.7)
Immature Grans (Abs): 0 10*3/uL (ref 0.0–0.1)
Immature Granulocytes: 0 %
Lymphocytes Absolute: 2.1 10*3/uL (ref 0.7–3.1)
Lymphs: 38 %
MCH: 31.9 pg (ref 26.6–33.0)
MCHC: 34.2 g/dL (ref 31.5–35.7)
MCV: 94 fL (ref 79–97)
MONOS ABS: 0.5 10*3/uL (ref 0.1–0.9)
Monocytes: 10 %
Neutrophils Absolute: 2.8 10*3/uL (ref 1.4–7.0)
Neutrophils: 50 %
PLATELETS: 194 10*3/uL (ref 150–450)
RBC: 4.79 x10E6/uL (ref 4.14–5.80)
RDW: 12.3 % (ref 12.3–15.4)
WBC: 5.6 10*3/uL (ref 3.4–10.8)

## 2017-10-28 LAB — LIPID PANEL
CHOL/HDL RATIO: 3.3 ratio (ref 0.0–5.0)
Cholesterol, Total: 139 mg/dL (ref 100–199)
HDL: 42 mg/dL (ref >39)
LDL CALC: 86 mg/dL (ref 0–99)
Triglycerides: 57 mg/dL (ref 0–149)
VLDL CHOLESTEROL CAL: 11 mg/dL (ref 5–40)

## 2017-10-29 ENCOUNTER — Telehealth: Payer: Self-pay

## 2017-10-29 NOTE — Telephone Encounter (Signed)
Pt advised.   Thanks,   -Laura  

## 2017-10-29 NOTE — Telephone Encounter (Signed)
-----   Message from Margo Common, [place] sent at 10/29/2017  8:35 AM EDT ----- All blood tests normal. Good cholesterol levels. Continue present medications and recheck in 4 months.

## 2017-10-30 NOTE — Telephone Encounter (Signed)
error 

## 2017-11-24 ENCOUNTER — Other Ambulatory Visit: Payer: Self-pay | Admitting: Family Medicine

## 2017-11-24 ENCOUNTER — Telehealth: Payer: Self-pay | Admitting: Family Medicine

## 2017-11-24 DIAGNOSIS — R1013 Epigastric pain: Secondary | ICD-10-CM

## 2017-11-24 MED ORDER — FAMOTIDINE 10 MG PO TABS: 10.0000 mg | ORAL_TABLET | Freq: Two times a day (BID) | ORAL | 3 refills | Status: DC

## 2017-11-24 NOTE — Telephone Encounter (Signed)
Pt returned missed call to Mickel Baas.  Please call pt back.  Thanks, [employer]

## 2017-11-24 NOTE — Telephone Encounter (Signed)
LMTCB 11/24/2017  Thanks,   -Mickel Baas

## 2017-11-24 NOTE — Telephone Encounter (Signed)
Will change to Famotadine (Pepcid) 10 mg qd.

## 2017-11-24 NOTE — Telephone Encounter (Signed)
°  Pt was taking Ranitidine - Zantac 75 mg for his stomach acid, but stopped because it was taken off of the "shelf". Pt is now needing to change to Nexium.  Pt needs this called into [employer] because he gets gets $50 for over the counter medications.  Please let pt know if this can be done.  Thanks, [employer]

## 2017-11-25 NOTE — Telephone Encounter (Signed)
Pt advised.   Thanks,   -Laura  

## 2017-12-21 ENCOUNTER — Telehealth: Payer: Self-pay | Admitting: Family Medicine

## 2017-12-21 MED ORDER — OMEPRAZOLE 20 MG PO CPDR
20.0000 mg | DELAYED_RELEASE_CAPSULE | Freq: Every day | ORAL | 3 refills | Status: DC
Start: 2017-12-21 — End: 2018-03-01

## 2017-12-21 NOTE — Telephone Encounter (Signed)
Pt needing his famotidine (PEPCID) 10 MG tablet changed to omeprazole -  prilosec with [employer].  The omeprazole -  prilosec  will be free if it is changed. The famotidine is costing $30 a month.   Please fill with:   [facility] [place] (Phone) [phone] (Fax)    Thanks, [facility]

## 2018-01-05 DIAGNOSIS — L718 Other rosacea: Secondary | ICD-10-CM | POA: Diagnosis not present

## 2018-03-01 ENCOUNTER — Encounter: Payer: Self-pay | Admitting: Family Medicine

## 2018-03-01 ENCOUNTER — Ambulatory Visit (INDEPENDENT_AMBULATORY_CARE_PROVIDER_SITE_OTHER): Payer: Medicare HMO | Admitting: Family Medicine

## 2018-03-01 VITALS — BP 138/70 | HR 72 | Temp 97.5°F | Resp 16 | Wt 181.2 lb

## 2018-03-01 DIAGNOSIS — K219 Gastro-esophageal reflux disease without esophagitis: Secondary | ICD-10-CM

## 2018-03-01 DIAGNOSIS — I10 Essential (primary) hypertension: Secondary | ICD-10-CM | POA: Diagnosis not present

## 2018-03-01 DIAGNOSIS — E7800 Pure hypercholesterolemia, unspecified: Secondary | ICD-10-CM | POA: Diagnosis not present

## 2018-03-01 MED ORDER — ATORVASTATIN CALCIUM 40 MG PO TABS: 40.0000 mg | ORAL_TABLET | Freq: Every day | ORAL | 3 refills | Status: DC

## 2018-03-01 MED ORDER — OMEPRAZOLE 20 MG PO CPDR: 20.0000 mg | DELAYED_RELEASE_CAPSULE | Freq: Every day | ORAL | 3 refills | Status: DC

## 2018-03-01 MED ORDER — LOSARTAN POTASSIUM-HCTZ 50-12.5 MG PO TABS: 1.0000 | ORAL_TABLET | Freq: Every day | ORAL | 3 refills | Status: DC

## 2018-03-01 NOTE — Progress Notes (Signed)
Patient: Michael [name] Male    DOB: [birth year]   73 y.o.   MRN: [id number] Visit Date: 03/01/2018  Today's Provider: Vernie Murders, PA   Chief Complaint  Patient presents with  . Follow-up    Hypertension   Subjective:     HPI  Hypertension: Patient here for follow-up of elevated blood pressure. He is exercising and is adherent to low salt diet.  Blood pressure 143/70  at home. Cardiac symptoms none. Patient denies chest pain, chest pressure/discomfort, exertional chest pressure/discomfort, fatigue, irregular heart beat, lower extremity edema, near-syncope and palpitations.  Cardiovascular risk factors: advanced age (older than 68 for men, 41 for women), dyslipidemia, hypertension and male gender    GERD, Follow up: Patient requesting refiils on the Omeprazole. Reports that a prescription for Pepcid was sent in for him but it didn't help. Reports the Omeprazole is free for him and that is working great for him.  He reports excellent compliance with treatment. He is not having side effects. [name] Kitchen  ------------------------------------------------------------------------   Lipid/Cholesterol, Follow-up:   Patient here to follow-up on his cholesterol and medication refill. Management changes since that visit include none . Last Lipid Panel:    Component Value Date/Time   CHOL 139 10/27/2017 0919   TRIG 57 10/27/2017 0919   HDL 42 10/27/2017 0919   CHOLHDL 3.3 10/27/2017 0919   CHOLHDL 3.5 12/09/2016 0903   LDLCALC 86 10/27/2017 0919   LDLCALC 107 (H) 12/09/2016 0903    Risk factors for vascular disease include hypertension  He reports excellent compliance with treatment. He is not having side effects.  Current symptoms include none and have been stable.   -------------------------------------------------------------------  History reviewed. No pertinent past medical history.   Patient Active Problem List   Diagnosis Date Noted  . Erectile dysfunction due to  arterial insufficiency 07/16/2017  . Low back ache 11/16/2014  . Anxiety 08/21/2014  . Abnormal finding on thyroid function test 08/21/2014  . BPH without obstruction/lower urinary tract symptoms 08/21/2014  . ED (erectile dysfunction) of organic origin 08/21/2014  . Exposure to viral hepatitis 08/21/2014  . Hypercholesteremia 08/21/2014  . Benign hypertension 08/21/2014  . Severe obstructive sleep apnea 08/21/2014  . Sleep disturbance 08/21/2014  . Abnormal prostate specific antigen 10/21/2011  . Benign prostatic hyperplasia with urinary obstruction 10/21/2011  . Elevated prostate specific antigen (PSA) 10/21/2011   Past Surgical History:  Procedure Laterality Date  . AMPUTATION FINGER Left 1965   5TH DIGIT, INDUSTRIAL ACCIDENT  . PROSTATE BIOPSY  03/1998   NEGATIVE   Family History  Problem Relation Age of Onset  . Emphysema Mother   . Diabetes Maternal Uncle    Allergies  Allergen Reactions  . Penicillins   . Sulfa Antibiotics   . Sulfamethoxazole-Trimethoprim     Current Outpatient Medications:  .  aspirin 81 MG EC tablet, Take by mouth., Disp: , Rfl:  .  atorvastatin (LIPITOR) 40 MG tablet, TAKE 1 TABLET AT BEDTIME, Disp: 90 tablet, Rfl: 3 .  Denture Care Products (EFFERDENT DENTURE CLEANSER) TBEF, , Disp: , Rfl:  .  dutasteride (AVODART) 0.5 MG capsule, Take 1 capsule (0.5 mg total) by mouth daily., Disp: 90 capsule, Rfl: 1 .  losartan-hydrochlorothiazide (HYZAAR) 50-12.5 MG tablet, TAKE 1 TABLET EVERY DAY, Disp: 90 tablet, Rfl: 3 .  omeprazole (PRILOSEC) 20 MG capsule, Take 1 capsule (20 mg total) by mouth daily., Disp: 30 capsule, Rfl: 3 .  sildenafil (REVATIO) 20 MG tablet, Take 1-5 tablets  as needed 1 hour prior to intercourse, Disp: 90 tablet, Rfl: 3 .  traZODone (DESYREL) 100 MG tablet, Take 2 tablets (200 mg total) by mouth daily., Disp: 180 tablet, Rfl: 3 .  flurbiprofen (ANSAID) 100 MG tablet, Take 1 tablet (100 mg total) by mouth 2 (two) times daily. As  needed for back pain. (Patient not taking: Reported on 03/01/2018), Disp: 180 tablet, Rfl: 1  Review of Systems  Constitutional: Negative.   HENT: Negative.   Respiratory: Negative.   Cardiovascular: Negative.   Gastrointestinal: Negative for abdominal pain and blood in stool.       Heartburn frequently with taking Pepcid. Better control on the Omeprazole prn.   Social History   Tobacco Use  . Smoking status: Never Smoker  . Smokeless tobacco: Never Used  Substance Use Topics  . Alcohol use: No    Alcohol/week: 0.0 standard drinks     Objective:   BP 138/70 (BP Location: Right Arm, Patient Position: Sitting, Cuff Size: Large)   Pulse 72   Temp (!) 97.5 F (36.4 C) (Oral)   Resp 16   Wt 181 lb 3.2 oz (82.2 kg)   BMI 27.55 kg/m    Wt Readings from Last 3 Encounters:  03/01/18 181 lb 3.2 oz (82.2 kg)  10/27/17 175 lb (79.4 kg)  07/16/17 170 lb 9.6 oz (77.4 kg)   Vitals:   03/01/18 0804  BP: 138/70  Pulse: 72  Resp: 16  Temp: (!) 97.5 F (36.4 C)  TempSrc: Oral  Weight: 181 lb 3.2 oz (82.2 kg)   Physical Exam Constitutional:      General: He is not in acute distress.    Appearance: He is well-developed.  HENT:     Head: Normocephalic and atraumatic.     Right Ear: Hearing normal.     Left Ear: Hearing normal.     Nose: Nose normal.  Eyes:     General: Lids are normal. No scleral icterus.       Right eye: No discharge.        Left eye: No discharge.     Conjunctiva/sclera: Conjunctivae normal.  Neck:     Musculoskeletal: Neck supple.  Cardiovascular:     Rate and Rhythm: Normal rate and regular rhythm.     Pulses: Normal pulses.     Heart sounds: Normal heart sounds.  Pulmonary:     Effort: Pulmonary effort is normal. No respiratory distress.  Abdominal:     General: Bowel sounds are normal.     Palpations: Abdomen is soft.  Musculoskeletal: Normal range of motion.  Skin:    Findings: No lesion or rash.  Neurological:     Mental Status: He is alert  and oriented to person, place, and time.  Psychiatric:        Speech: Speech normal.        Behavior: Behavior normal.        Thought Content: Thought content normal.       Assessment & Plan    1. Benign hypertension BP stable and well controlled on the Hyzaar. No muscle cramps, palpitations, edema or dyspnea. Recheck routine labs and refilled Hyzaar. Follow up pending reports. - losartan-hydrochlorothiazide (HYZAAR) 50-12.5 MG tablet; Take 1 tablet by mouth daily.  Dispense: 90 tablet; Refill: 3 - CBC with Differential/Platelet - Comprehensive metabolic panel - TSH  2. Hypercholesteremia Tolerating the Lipitor without myalgias or arthralgias. Continue low fat diet and refill Lipitor. Check labs and follow up pending reports. - atorvastatin (  LIPITOR) 40 MG tablet; Take 1 tablet (40 mg total) by mouth at bedtime.  Dispense: 90 tablet; Refill: 3 - Comprehensive metabolic panel - Lipid panel - TSH  3. Gastroesophageal reflux disease, esophagitis presence not specified No hematemesis, melena or hematochezia. No abdominal pain or difficulty swallowing. May switch back to the Omeprazole since the H2 Blocker was no help. Check CBC. If any sign of anemia, or persistent acid indigestion with reflux, may need referral to gastroenterologist for EGD. - omeprazole (PRILOSEC) 20 MG capsule; Take 1 capsule (20 mg total) by mouth daily.  Dispense: 90 capsule; Refill: 3 - CBC with Differential/Platelet     Vernie Murders, PA  [facility]

## 2018-03-02 ENCOUNTER — Telehealth: Payer: Self-pay

## 2018-03-02 LAB — COMPREHENSIVE METABOLIC PANEL
ALT: 26 IU/L (ref 0–44)
AST: 34 IU/L (ref 0–40)
Albumin/Globulin Ratio: 2.1 (ref 1.2–2.2)
Albumin: 4.4 g/dL (ref 3.5–4.8)
Alkaline Phosphatase: 74 IU/L (ref 39–117)
BUN/Creatinine Ratio: 13 (ref 10–24)
BUN: 14 mg/dL (ref 8–27)
Bilirubin Total: 0.5 mg/dL (ref 0.0–1.2)
CO2: 25 mmol/L (ref 20–29)
Calcium: 9.5 mg/dL (ref 8.6–10.2)
Chloride: 101 mmol/L (ref 96–106)
Creatinine, Ser: 1.08 mg/dL (ref 0.76–1.27)
GFR calc Af Amer: 80 mL/min/{1.73_m2} (ref >59)
GFR calc non Af Amer: 69 mL/min/{1.73_m2} (ref >59)
Globulin, Total: 2.1 g/dL (ref 1.5–4.5)
Glucose: 98 mg/dL (ref 65–99)
Potassium: 4 mmol/L (ref 3.5–5.2)
Sodium: 141 mmol/L (ref 134–144)
Total Protein: 6.5 g/dL (ref 6.0–8.5)

## 2018-03-02 LAB — CBC WITH DIFFERENTIAL/PLATELET
Basophils Absolute: 0 10*3/uL (ref 0.0–0.2)
Basos: 0 %
EOS (ABSOLUTE): 0.1 10*3/uL (ref 0.0–0.4)
Eos: 2 %
HEMOGLOBIN: 15.6 g/dL (ref 13.0–17.7)
Hematocrit: 45.9 % (ref 37.5–51.0)
Immature Grans (Abs): 0 10*3/uL (ref 0.0–0.1)
Immature Granulocytes: 0 %
LYMPHS: 17 %
Lymphocytes Absolute: 0.9 10*3/uL (ref 0.7–3.1)
MCH: 30.7 pg (ref 26.6–33.0)
MCHC: 34 g/dL (ref 31.5–35.7)
MCV: 90 fL (ref 79–97)
MONOCYTES: 15 %
Monocytes Absolute: 0.8 10*3/uL (ref 0.1–0.9)
NEUTROS PCT: 66 %
Neutrophils Absolute: 3.3 10*3/uL (ref 1.4–7.0)
Platelets: 217 10*3/uL (ref 150–450)
RBC: 5.08 x10E6/uL (ref 4.14–5.80)
RDW: 12.2 % (ref 11.6–15.4)
WBC: 5.1 10*3/uL (ref 3.4–10.8)

## 2018-03-02 LAB — LIPID PANEL
Chol/HDL Ratio: 3.8 ratio (ref 0.0–5.0)
Cholesterol, Total: 167 mg/dL (ref 100–199)
HDL: 44 mg/dL (ref >39)
LDL CALC: 108 mg/dL — AB (ref 0–99)
Triglycerides: 73 mg/dL (ref 0–149)
VLDL Cholesterol Cal: 15 mg/dL (ref 5–40)

## 2018-03-02 LAB — TSH: TSH: 5.99 u[IU]/mL — ABNORMAL HIGH (ref 0.450–4.500)

## 2018-03-02 NOTE — Telephone Encounter (Signed)
-----   Message from Margo Common, [place] sent at 03/02/2018  7:56 AM EST ----- All blood tests essentially normal. Continue all medications and recheck in 4-6 months.

## 2018-03-02 NOTE — Telephone Encounter (Signed)
Patient advised as directed below.  Thanks,  -Derik Fults 

## 2018-03-29 ENCOUNTER — Encounter: Payer: Self-pay | Admitting: Family Medicine

## 2018-03-29 ENCOUNTER — Ambulatory Visit (INDEPENDENT_AMBULATORY_CARE_PROVIDER_SITE_OTHER): Payer: Medicare HMO | Admitting: Family Medicine

## 2018-03-29 VITALS — BP 138/84 | HR 64 | Temp 98.1°F | Resp 16 | Wt 186.0 lb

## 2018-03-29 DIAGNOSIS — H6981 Other specified disorders of Eustachian tube, right ear: Secondary | ICD-10-CM | POA: Diagnosis not present

## 2018-03-29 NOTE — Progress Notes (Signed)
Patient: Michael [name] Male    DOB: 08-Nov-1945   73 y.o.   MRN: [id number] Visit Date: 03/29/2018  Today's Provider: Vernie Murders, PA   Chief Complaint  Patient presents with  . Ear Fullness   Subjective:     HPI   Patient states his right ear feels full or stopped up. Patient states his ear is popping and cracking.   History reviewed. No pertinent past medical history. Patient Active Problem List   Diagnosis Date Noted  . Erectile dysfunction due to arterial insufficiency 07/16/2017  . Low back ache 11/16/2014  . Anxiety 08/21/2014  . Abnormal finding on thyroid function test 08/21/2014  . BPH without obstruction/lower urinary tract symptoms 08/21/2014  . ED (erectile dysfunction) of organic origin 08/21/2014  . Exposure to viral hepatitis 08/21/2014  . Hypercholesteremia 08/21/2014  . Benign hypertension 08/21/2014  . Severe obstructive sleep apnea 08/21/2014  . Sleep disturbance 08/21/2014  . Abnormal prostate specific antigen 10/21/2011  . Benign prostatic hyperplasia with urinary obstruction 10/21/2011  . Elevated prostate specific antigen (PSA) 10/21/2011   Past Surgical History:  Procedure Laterality Date  . AMPUTATION FINGER Left 1965   5TH DIGIT, INDUSTRIAL ACCIDENT  . PROSTATE BIOPSY  03/1998   NEGATIVE   Family History  Problem Relation Age of Onset  . Emphysema Mother   . Diabetes Maternal Uncle    Allergies  Allergen Reactions  . Penicillins   . Sulfa Antibiotics   . Sulfamethoxazole-Trimethoprim     Current Outpatient Medications:  .  aspirin 81 MG EC tablet, Take by mouth., Disp: , Rfl:  .  atorvastatin (LIPITOR) 40 MG tablet, Take 1 tablet (40 mg total) by mouth at bedtime., Disp: 90 tablet, Rfl: 3 .  Denture Care Products (EFFERDENT DENTURE CLEANSER) TBEF, , Disp: , Rfl:  .  dutasteride (AVODART) 0.5 MG capsule, Take 1 capsule (0.5 mg total) by mouth daily., Disp: 90 capsule, Rfl: 1 .  flurbiprofen (ANSAID) 100 MG tablet, Take 1  tablet (100 mg total) by mouth 2 (two) times daily. As needed for back pain., Disp: 180 tablet, Rfl: 1 .  losartan-hydrochlorothiazide (HYZAAR) 50-12.5 MG tablet, Take 1 tablet by mouth daily., Disp: 90 tablet, Rfl: 3 .  omeprazole (PRILOSEC) 20 MG capsule, Take 1 capsule (20 mg total) by mouth daily., Disp: 90 capsule, Rfl: 3 .  sildenafil (REVATIO) 20 MG tablet, Take 1-5 tablets as needed 1 hour prior to intercourse, Disp: 90 tablet, Rfl: 3 .  traZODone (DESYREL) 100 MG tablet, Take 2 tablets (200 mg total) by mouth daily., Disp: 180 tablet, Rfl: 3  Review of Systems  Constitutional: Negative for appetite change, chills and fever.  Respiratory: Negative for chest tightness, shortness of breath and wheezing.   Cardiovascular: Negative for chest pain and palpitations.  Gastrointestinal: Negative for abdominal pain, nausea and vomiting.   Social History   Tobacco Use  . Smoking status: Never Smoker  . Smokeless tobacco: Never Used  Substance Use Topics  . Alcohol use: No    Alcohol/week: 0.0 standard drinks     Objective:   BP 138/84 (BP Location: Right Arm, Patient Position: Sitting, Cuff Size: Large)   Pulse 64   Temp 98.1 F (36.7 C) (Oral)   Resp 16   Wt 186 lb (84.4 kg)   SpO2 98%   BMI 28.28 kg/m  Vitals:   03/29/18 0837  BP: 138/84  Pulse: 64  Resp: 16  Temp: 98.1 F (36.7 C)  TempSrc:  Oral  SpO2: 98%  Weight: 186 lb (84.4 kg)   Physical Exam Constitutional:      General: He is not in acute distress.    Appearance: He is well-developed.  HENT:     Head: Normocephalic and atraumatic.     Right Ear: Hearing, tympanic membrane, ear canal and external ear normal. There is no impacted cerumen.     Left Ear: Hearing, tympanic membrane, ear canal and external ear normal. There is no impacted cerumen.     Nose: Nose normal.     Mouth/Throat:     Pharynx: Oropharynx is clear.  Eyes:     General: Lids are normal. No scleral icterus.       Right eye: No discharge.         Left eye: No discharge.     Conjunctiva/sclera: Conjunctivae normal.  Neck:     Musculoskeletal: Neck supple.  Cardiovascular:     Rate and Rhythm: Normal rate and regular rhythm.     Heart sounds: Normal heart sounds.  Pulmonary:     Effort: Pulmonary effort is normal. No respiratory distress.  Abdominal:     Palpations: Abdomen is soft.  Musculoskeletal: Normal range of motion.  Skin:    Findings: No lesion or rash.  Neurological:     Mental Status: He is alert and oriented to person, place, and time.  Psychiatric:        Speech: Speech normal.        Behavior: Behavior normal.        Thought Content: Thought content normal.       Assessment & Plan    1. Dysfunction of right eustachian tube Onset over the past week with some popping and crackling in the right ear. No fever or URI symptoms. No cerumen impaction. May use Flonase at bedtime for eustachian fluid/pressure. Recheck prn.     Vernie Murders, PA  [facility]

## 2018-05-24 ENCOUNTER — Ambulatory Visit: Payer: Self-pay | Admitting: Family Medicine

## 2018-07-13 ENCOUNTER — Other Ambulatory Visit: Payer: Medicare HMO

## 2018-07-13 ENCOUNTER — Other Ambulatory Visit: Payer: Self-pay

## 2018-07-13 DIAGNOSIS — R972 Elevated prostate specific antigen [PSA]: Secondary | ICD-10-CM | POA: Diagnosis not present

## 2018-07-14 ENCOUNTER — Other Ambulatory Visit: Payer: Medicare HMO

## 2018-07-14 LAB — PSA: Prostate Specific Ag, Serum: 2.9 ng/mL (ref 0.0–4.0)

## 2018-07-21 ENCOUNTER — Other Ambulatory Visit: Payer: Self-pay

## 2018-07-21 ENCOUNTER — Telehealth (INDEPENDENT_AMBULATORY_CARE_PROVIDER_SITE_OTHER): Payer: Medicare HMO | Admitting: Urology

## 2018-07-21 ENCOUNTER — Other Ambulatory Visit: Payer: Self-pay | Admitting: Family Medicine

## 2018-07-21 DIAGNOSIS — N5201 Erectile dysfunction due to arterial insufficiency: Secondary | ICD-10-CM

## 2018-07-21 DIAGNOSIS — R972 Elevated prostate specific antigen [PSA]: Secondary | ICD-10-CM | POA: Diagnosis not present

## 2018-07-21 DIAGNOSIS — N400 Enlarged prostate without lower urinary tract symptoms: Secondary | ICD-10-CM

## 2018-07-21 MED ORDER — SILDENAFIL CITRATE 20 MG PO TABS: ORAL_TABLET | ORAL | 0 refills | Status: DC

## 2018-07-21 MED ORDER — DUTASTERIDE 0.5 MG PO CAPS: 0.5000 mg | ORAL_CAPSULE | Freq: Every day | ORAL | 1 refills | Status: DC

## 2018-07-21 NOTE — Progress Notes (Signed)
Virtual Visit via Telephone Note  I connected with Michael [name]. on 07/21/18 at  9:00 AM EDT by telephone and verified that I am speaking with the correct person using two identifiers.  Location: Patient: Home Provider: Home   I discussed the limitations, risks, security and privacy concerns of performing an evaluation and management service by telephone and the availability of in person appointments. I also discussed with the patient that there may be a patient responsible charge related to this service. The patient expressed understanding and agreed to proceed.   History of Present Illness: 73 year old male presents for telephone follow-up due to COVID-19 pandemic.  Urologic problem list: -Elevated PSA; prostate biopsy 2000 for PSA of 4.6 with benign pathology  -BPH with lower urinary tract symptoms; on dutasteride 3 times weekly  -Erectile dysfunction; generic sildenafil at 69 mg 73 year old male  He has had no problems over the past year.  His PSA since 2014 has fluctuated between 2.0 (2019) and 3.16 (2016).  He denies bothersome lower urinary tract symptoms.  He remains on dutasteride 3 times weekly and is also taking sildenafil for erectile dysfunction.  Denies dysuria, gross hematuria or flank/abdominal/pelvic/scrotal pain. Uncorrected PSA drawn 07/13/2018 stable at 2.9.   Observations/Objective: N/A  Assessment and Plan: -Elevated PSA: Uncorrected PSA is stable.  -BPH: No bothersome lower urinary tract symptoms on dutasteride which was refilled  -Erectile dysfunction: Stable on sildenafil which was refilled  Follow Up Instructions: 1 year for PSA/DRE   I discussed the assessment and treatment plan with the patient. The patient was provided an opportunity to ask questions and all were answered. The patient agreed with the plan and demonstrated an understanding of the instructions.   The patient was advised to call back or seek an in-person evaluation if the  symptoms worsen or if the condition fails to improve as anticipated.  I provided 12 minutes of non-face-to-face time during this encounter.   Abbie Sons, MD

## 2018-07-28 DIAGNOSIS — S00452A Superficial foreign body of left ear, initial encounter: Secondary | ICD-10-CM | POA: Diagnosis not present

## 2018-08-05 ENCOUNTER — Ambulatory Visit (INDEPENDENT_AMBULATORY_CARE_PROVIDER_SITE_OTHER): Payer: Medicare HMO | Admitting: Family Medicine

## 2018-08-05 ENCOUNTER — Other Ambulatory Visit: Payer: Self-pay

## 2018-08-05 ENCOUNTER — Encounter: Payer: Self-pay | Admitting: Family Medicine

## 2018-08-05 VITALS — BP 152/80 | HR 72 | Temp 98.4°F | Wt 172.0 lb

## 2018-08-05 DIAGNOSIS — F419 Anxiety disorder, unspecified: Secondary | ICD-10-CM

## 2018-08-05 DIAGNOSIS — R1310 Dysphagia, unspecified: Secondary | ICD-10-CM | POA: Diagnosis not present

## 2018-08-05 NOTE — Patient Instructions (Signed)
Dysphagia  Dysphagia is trouble swallowing. This condition occurs when solids and liquids stick in a person's throat on the way down to the stomach, or when food takes longer to get to the stomach. You may have problems swallowing food, liquids, or both. You may also have pain while trying to swallow. It may take you more time and effort to swallow something. What are the causes? This condition is caused by:  Problems with the muscles. They may make it difficult for you to move food and liquids through the tube that connects your mouth to your stomach (esophagus). You may have ulcers, scar tissue, or inflammation that blocks the normal passage of food and liquids. Causes of these problems include: ? Acid reflux from your stomach into your esophagus (gastroesophageal reflux). ? Infections. ? Radiation treatment for cancer. ? Medicines taken without enough fluids to wash them down into your stomach.  Nerve problems. These prevent signals from being sent to the muscles of your esophagus to squeeze (contract) and move what you swallow down to your stomach.  Globus pharyngeus. This is a common problem that involves feeling like something is stuck in the throat or a sense of trouble with swallowing even though nothing is wrong with the swallowing passages.  Stroke. This can affect the nerves and make it difficult to swallow.  Certain conditions, such as cerebral palsy or Parkinson disease. What are the signs or symptoms? Common symptoms of this condition include:  A feeling that solids or liquids are stuck in your throat on the way down to the stomach.  Food taking too long to get to the stomach. Other symptoms include:  Food moving back from your stomach to your mouth (regurgitation).  Noises coming from your throat.  Chest discomfort with swallowing.  A feeling of fullness when swallowing.  Drooling, especially when the throat is blocked.  Pain while swallowing.  Heartburn.   Coughing or gagging while trying to swallow. How is this diagnosed? This condition is diagnosed by:  Barium X-ray. In this test, you swallow a white substance (contrast medium)that sticks to the inside of your esophagus. X-ray images are then taken.  Endoscopy. In this test, a flexible telescope is inserted down your throat to look at your esophagus and your stomach.  CT scans and MRI. How is this treated? Treatment for dysphagia depends on the cause of the condition:  If the dysphagia is caused by acid reflux or infection, medicines may be used. They may include antibiotics and heartburn medicines.  If the dysphagia is caused by problems with your muscles, swallowing therapy may be used to help you strengthen your swallowing muscles. You may have to do specific exercises to strengthen the muscles or stretch them.  If the dysphagia is caused by a blockage or mass, procedures to remove the blockage may be done. You may need surgery and a feeding tube. You may need to make diet changes. Ask your health care provider for specific instructions. Follow these instructions at home: Eating and drinking  Try to eat soft food that is easier to swallow.  Follow any diet changes as told by your health care provider.  Cut your food into small pieces and eat slowly.  Eat and drink only when you are sitting upright.  Do not drink alcohol or caffeine. If you need help quitting, ask your health care provider. General instructions  Check your weight every day to make sure you are not losing weight.  Take over-the-counter and prescription medicines only  as told by your health care provider.  If you were prescribed an antibiotic medicine, take it as told by your health care provider. Do not stop taking the antibiotic even if you start to feel better.  Do not use any products that contain nicotine or tobacco, such as cigarettes and e-cigarettes. If you need help quitting, ask your health care  provider.  Keep all follow-up visits as told by your health care provider. This is important. Contact a health care provider if:  You lose weight because you cannot swallow.  You cough when you drink liquids (aspiration).  You cough up partially digested food. Get help right away if:  You cannot swallow your saliva.  You have shortness of breath or a fever, or both.  You have a hoarse voice and also have trouble swallowing. Summary  Dysphagia is trouble swallowing. This condition occurs when solids and liquids stick in a person's throat on the way down to the stomach, or when food takes longer to get to the stomach.  Dysphagia has many possible causes and symptoms.  Treatment for dysphagia depends on the cause of the condition. This information is not intended to replace advice given to you by your health care provider. Make sure you discuss any questions you have with your health care provider. Document Released: 02/08/2000 Document Revised: 01/31/2016 Document Reviewed: 01/31/2016 Elsevier Interactive Patient Education  2019 Else[employer]   Dysphagia Eating Plan, Bite Size Food This diet plan is for people with moderate swallowing problems who have transitioned from pureed and minced foods. Bite size foods are soft and cut into small chunks so that they can be swallowed safely. On this eating plan, you may be instructed to drink liquids that are thickened. Work with your health care provider and your diet and nutrition specialist (dietitian) to make sure that you are following the diet safely and getting all the nutrients you need. What are tips for following this plan? General guidelines for foods   You may eat foods that are tender, soft, and moist.  Always test food texture before taking a bite. Poke food with a fork or spoon to make sure it is tender.  Food should be easy to cut and shew. Avoid large pieces of food that require a lot of chewing.  Take small bites.  Each bite should be smaller than your thumb nail (about 15mm by 15 mm).  If you were on pureed and minced food diet plans, you may eat any of the foods included in those diets.  Avoid foods that are very dry, hard, sticky, chewy, coarse, or crunchy.  If instructed by your health care provider, thicken liquids. Follow your health care provider's instructions for what products to use, how to do this, and to what thickness. ? Your health care provider may recommend using a commercial thickener, rice cereal, or potato flakes. Ask your health care provider to recommend thickeners. ? Thickened liquids are usually a "pudding-like" consistency, or they may be as thick as honey or thick enough to eat with a spoon. Cooking  To moisten foods, you may add liquids while you are blending, mashing, or grinding your foods to the right consistency. These liquids include gravies, sauces, vegetable or fruit juice, milk, half and half, or water.  Strain extra liquid from foods before eating.  Reheat foods slowly to prevent a tough crust from forming.  Prepare foods in advance. Meal planning  Eat a variety of foods to get all the nutrients you need.    Some foods may be tolerated better than others. Work with your dietitian to identify which foods are safest for you to eat.  Follow your meal plan as told by your dietitian. What foods are allowed? Grains Moist breads without nuts or seeds. Biscuits, muffins, pancakes, and waffles that are well-moistened with syrup, jelly, margarine, or butter. Cooked cereals. Moist bread stuffing. Moist rice. Well-moistened cold cereal with small chunks. Well-cooked pasta, noodles, rice, and bread dressing in small pieces and thick sauce. Soft dumplings or spaetzle in small pieces and butter or gravy. Vegetables Soft, well-cooked vegetables in small pieces. Soft-cooked, mashed potatoes. Thickened vegetable juice. Fruits Canned or cooked fruits that are soft or moist and do  not have skin or seeds. Fresh, soft bananas. Thickened fruit juices. Meat and other protein foods Tender, moist meats or poultry in small pieces. Moist meatballs or meatloaf. Fish without bones. Eggs or egg substitutes in small pieces. Tofu. Tempeh and meat alternatives in small pieces. Well-cooked, tender beans, peas, baked beans, and other legumes. Dairy Thickened milk. Cream cheese. Yogurt. Cottage cheese. Sour cream. Small pieces of soft cheese. Fats and oils Butter. Oils. Margarine. Mayonnaise. Gravy. Spreads. Sweets and desserts Soft, smooth, moist desserts. Pudding. Custard. Moist cakes. Jam. Jelly. Honey. Preserves. Ask your health care provider whether you can have frozen desserts. Seasoning and other foods All seasonings and sweeteners. All sauces with small chunks. Prepared tuna, egg, or chicken salad without raw fruits or vegetables. Moist casseroles with small, tender pieces of meat. Soups with tender meat. What foods are not allowed? Grains Coarse or dry cereals. Dry breads. Toast. Crackers. Tough, crusty breads, such as French bread and baguettes. Dry pancakes, waffles, and muffins. Sticky rice. Dry bread stuffing. Granola. Popcorn. Chips. Vegetables All raw vegetables. Cooked corn. Rubbery or stiff cooked vegetables. Stringy vegetables, such as celery. Tough, crisp fried potatoes. Potato skins. Fruits Hard, crunchy, stringy, high-pulp, and juicy raw fruits such as apples, pineapple, papaya, and watermelon. Small, round fruits, such as grapes. Dried fruit and fruit leather. Meat and other protein foods Large pieces of meat. Dry, tough meats, such as bacon, sausage, and hot dogs. Chicken, turkey, or fish with skin and bones. Crunchy peanut butter. Nuts. Seeds. Nut and seed butters. Dairy Yogurt with nuts, seeds, or large chunks. Large chunks of cheese. Frozen desserts and milk consistency not allowed by your dietitian. Sweets and desserts Dry cakes. Chewy or dry cookies. Any  desserts with nuts, seeds, dry fruits, coconut, pineapple, or anything dry, sticky, or hard. Chewy caramel. Licorice. Taffy-type candies. Ask your health care provider whether you can have frozen desserts. Seasoning and other foods Soups with tough or large chunks of meats, poultry, or vegetables. Corn or clam chowder. Smoothies with large chunks of fruit. Summary  Bite size foods can be helpful for people with moderate swallowing problems.  On the dysphagia eating plan, you may eat foods that are soft, moist, and cut into pieces smaller than 15mm by 15mm.  You may be instructed to thicken liquids. Follow your health care provider's instructions about how to do this and to what consistency. This information is not intended to replace advice given to you by your health care provider. Make sure you discuss any questions you have with your health care provider. Document Released: 02/10/2005 Document Revised: 05/23/2016 Document Reviewed: 05/23/2016 Elsevier Interactive Patient Education  2019 Elsevier Inc.  

## 2018-08-05 NOTE — Progress Notes (Signed)
Michael [name].  MRN: [id number] DOB: 09/19/1945  Subjective:  HPI   The patient is a 73 year old male who presents for evaluation of episodes of choking sensation.  He states that about twice a year he gets where he has an episode of not being able to swallow the food and get it to go all the way down.  He states that he had a severe episode on 4/25 and then a milder episode on 6/4.  He states that when it happens he has severe pain but no difficulty breathing.  However, he states when he has the episode it causes him to panic and he feels like he is going to have difficulty breathing.  Patient Active Problem List   Diagnosis Date Noted  . Erectile dysfunction due to arterial insufficiency 07/16/2017  . Low back ache 11/16/2014  . Anxiety 08/21/2014  . Abnormal finding on thyroid function test 08/21/2014  . BPH without obstruction/lower urinary tract symptoms 08/21/2014  . ED (erectile dysfunction) of organic origin 08/21/2014  . Exposure to viral hepatitis 08/21/2014  . Hypercholesteremia 08/21/2014  . Benign hypertension 08/21/2014  . Severe obstructive sleep apnea 08/21/2014  . Sleep disturbance 08/21/2014  . Elevated prostate specific antigen (PSA) 10/21/2011   No past medical history on file.  Past Surgical History:  Procedure Laterality Date  . AMPUTATION FINGER Left 1965   5TH DIGIT, INDUSTRIAL ACCIDENT  . PROSTATE BIOPSY  03/1998   NEGATIVE   Family History  Problem Relation Age of Onset  . Emphysema Mother   . Diabetes Maternal Uncle    Social History   Socioeconomic History  . Marital status: Married    Spouse name: Not on file  . Number of children: Not on file  . Years of education: Not on file  . Highest education level: Not on file  Occupational History  . Not on file  Social Needs  . Financial resource strain: Not on file  . Food insecurity    Worry: Not on file    Inability: Not on file  . Transportation needs    Medical: Not on file   Non-medical: Not on file  Tobacco Use  . Smoking status: Never Smoker  . Smokeless tobacco: Never Used  Substance and Sexual Activity  . Alcohol use: No    Alcohol/week: 0.0 standard drinks  . Drug use: No  . Sexual activity: Not Currently    Birth control/protection: None  Lifestyle  . Physical activity    Days per week: Not on file    Minutes per session: Not on file  . Stress: Not on file  Relationships  . Social Herbalist on phone: Not on file    Gets together: Not on file    Attends religious service: Not on file    Active member of club or organization: Not on file    Attends meetings of clubs or organizations: Not on file    Relationship status: Not on file  . Intimate partner violence    Fear of current or ex partner: Not on file    Emotionally abused: Not on file    Physically abused: Not on file    Forced sexual activity: Not on file  Other Topics Concern  . Not on file  Social History Narrative  . Not on file   Outpatient Encounter Medications as of 08/05/2018  Medication Sig  . aspirin 81 MG EC tablet Take by mouth.  [name] Kitchen atorvastatin (LIPITOR)  40 MG tablet Take 1 tablet (40 mg total) by mouth at bedtime.  . Denture Care Products (EFFERDENT DENTURE CLEANSER) TBEF   . dutasteride (AVODART) 0.5 MG capsule Take 1 capsule (0.5 mg total) by mouth daily.  . flurbiprofen (ANSAID) 100 MG tablet Take 1 tablet (100 mg total) by mouth 2 (two) times daily. As needed for back pain.  [name] Kitchen losartan-hydrochlorothiazide (HYZAAR) 50-12.5 MG tablet Take 1 tablet by mouth daily.  [name] Kitchen omeprazole (PRILOSEC) 20 MG capsule Take 1 capsule (20 mg total) by mouth daily.  . sildenafil (REVATIO) 20 MG tablet Take 1-5 tablets as needed 1 hour prior to intercourse  . traZODone (DESYREL) 100 MG tablet Take 2 tablets (200 mg total) by mouth daily.   No facility-administered encounter medications on file as of 08/05/2018.    Allergies  Allergen Reactions  . Penicillins   . Sulfa  Antibiotics   . Sulfamethoxazole-Trimethoprim    Review of Systems  Gastrointestinal: Negative for abdominal pain, heartburn, nausea and vomiting.       Difficulty swallowing.    Objective:  BP (!) 152/80 (BP Location: Right Arm, Patient Position: Sitting, Cuff Size: Normal)   Pulse 72   Temp 98.4 F (36.9 C) (Oral)   Wt 172 lb (78 kg)   SpO2 98%   BMI 26.15 kg/m    Wt Readings from Last 3 Encounters:  08/05/18 172 lb (78 kg)  03/29/18 186 lb (84.4 kg)  03/01/18 181 lb 3.2 oz (82.2 kg)   Physical Exam  Constitutional: He is oriented to person, place, and time and well-developed, well-nourished, and in no distress.  HENT:  Head: Normocephalic.  Eyes: Conjunctivae are normal.  Neck: Neck supple.  Cardiovascular: Normal rate and regular rhythm.  Pulmonary/Chest: Effort normal and breath sounds normal.  Abdominal: Soft. Bowel sounds are normal.  Musculoskeletal: Normal range of motion.  Neurological: He is alert and oriented to person, place, and time.  Skin: No rash noted.  Psychiatric: Mood, affect and judgment normal.    Assessment and Plan :   1. Dysphagia, unspecified type Has had infrequent episodes of esophageal discomfort when swallowing a large bolus of dry food. Most symptomatic episode was 06-19-18 when he was eating BBQ chicken. Did not have any difficulty breathing but felt the food was stuck and took a couple minutes to go down. This occurs 1-2 times a year in the past several years. Drinking extra fluid during the episode causes more discomfort for a while. Recommend he follow a dysphagia diet and drink liquids with meals. Wants to postpone any GI referral or Barium Swallow study.  2. Anxiety Continues to have intermittent flares. Still feels the Trazodone helps and takes it daily. Feels the dysphagia may have been related to anxiety over wife's health issues. Continue present medication and keep follow up appointment on 09-27-18 as planned.

## 2018-09-13 ENCOUNTER — Other Ambulatory Visit: Payer: Self-pay

## 2018-09-13 ENCOUNTER — Encounter: Payer: Self-pay | Admitting: Family Medicine

## 2018-09-13 ENCOUNTER — Ambulatory Visit (INDEPENDENT_AMBULATORY_CARE_PROVIDER_SITE_OTHER): Payer: Medicare HMO | Admitting: Family Medicine

## 2018-09-13 ENCOUNTER — Ambulatory Visit: Payer: Medicare HMO | Admitting: Family Medicine

## 2018-09-13 VITALS — BP 134/80 | HR 64 | Temp 98.3°F | Resp 16 | Wt 172.0 lb

## 2018-09-13 DIAGNOSIS — K219 Gastro-esophageal reflux disease without esophagitis: Secondary | ICD-10-CM | POA: Diagnosis not present

## 2018-09-13 DIAGNOSIS — F419 Anxiety disorder, unspecified: Secondary | ICD-10-CM | POA: Diagnosis not present

## 2018-09-13 DIAGNOSIS — G4733 Obstructive sleep apnea (adult) (pediatric): Secondary | ICD-10-CM | POA: Diagnosis not present

## 2018-09-13 DIAGNOSIS — R972 Elevated prostate specific antigen [PSA]: Secondary | ICD-10-CM

## 2018-09-13 DIAGNOSIS — M791 Myalgia, unspecified site: Secondary | ICD-10-CM | POA: Diagnosis not present

## 2018-09-13 DIAGNOSIS — R109 Unspecified abdominal pain: Secondary | ICD-10-CM

## 2018-09-13 IMAGING — CR DG KNEE COMPLETE 4+V*R*
1 series · 5 of 5 positions shown · non-contrast
Comparison: None.

CLINICAL DATA: Posterior right knee pain for 3 months. No known
injury.

EXAM:
RIGHT KNEE - COMPLETE 4+ VIEW

[Series 1: dg knee complete 4 views right · 0.14mm/px · 5 of 5 slices shown]
[im 1/5]
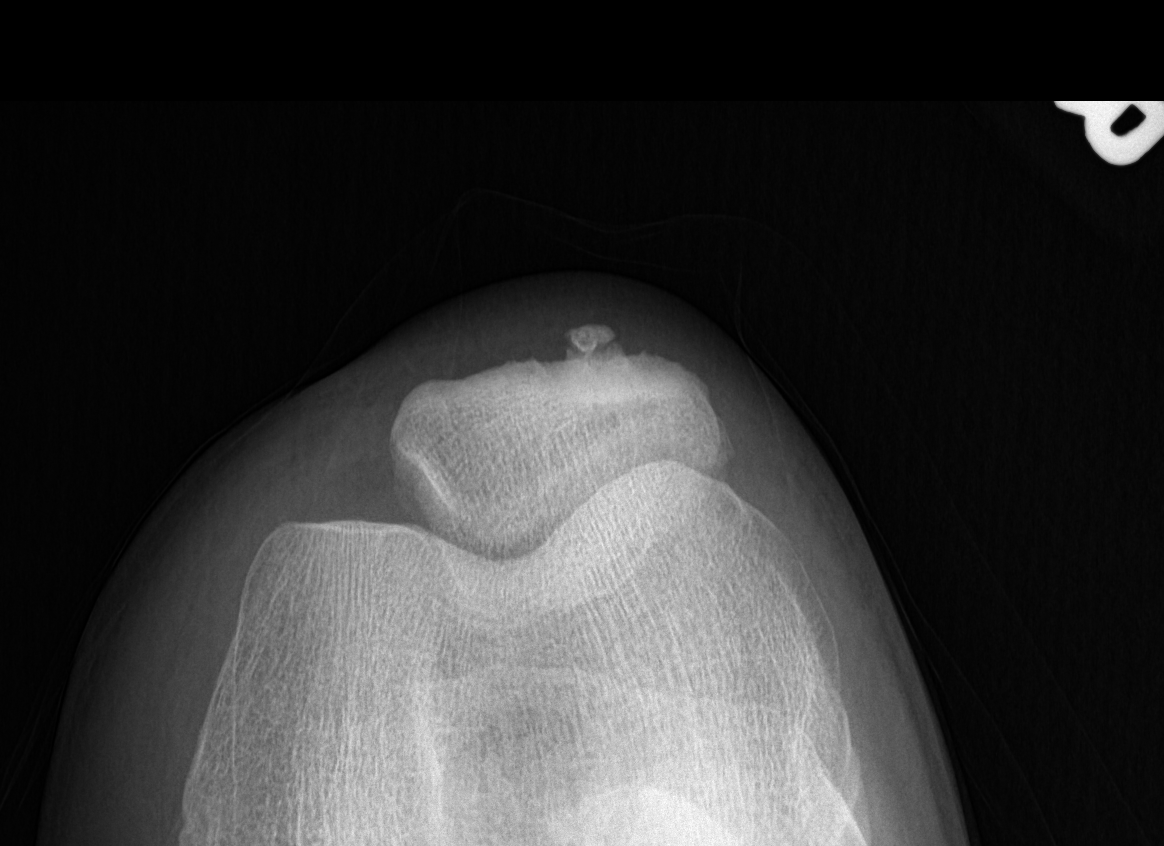
[im 2/5]
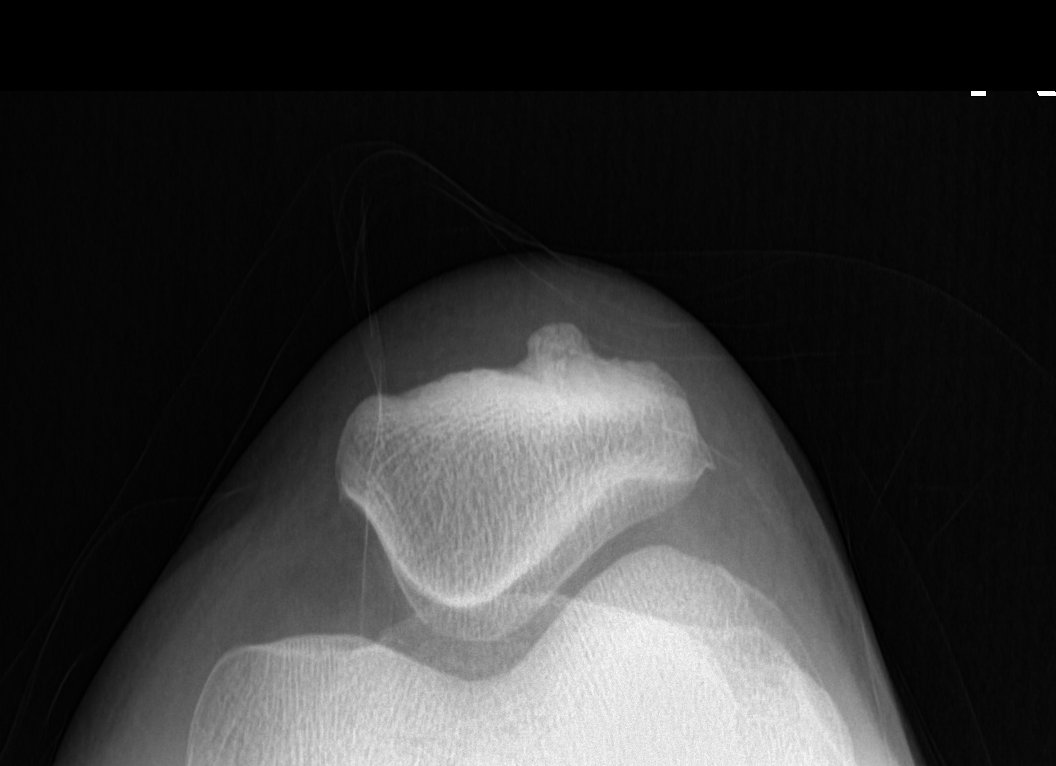
[im 3/5]
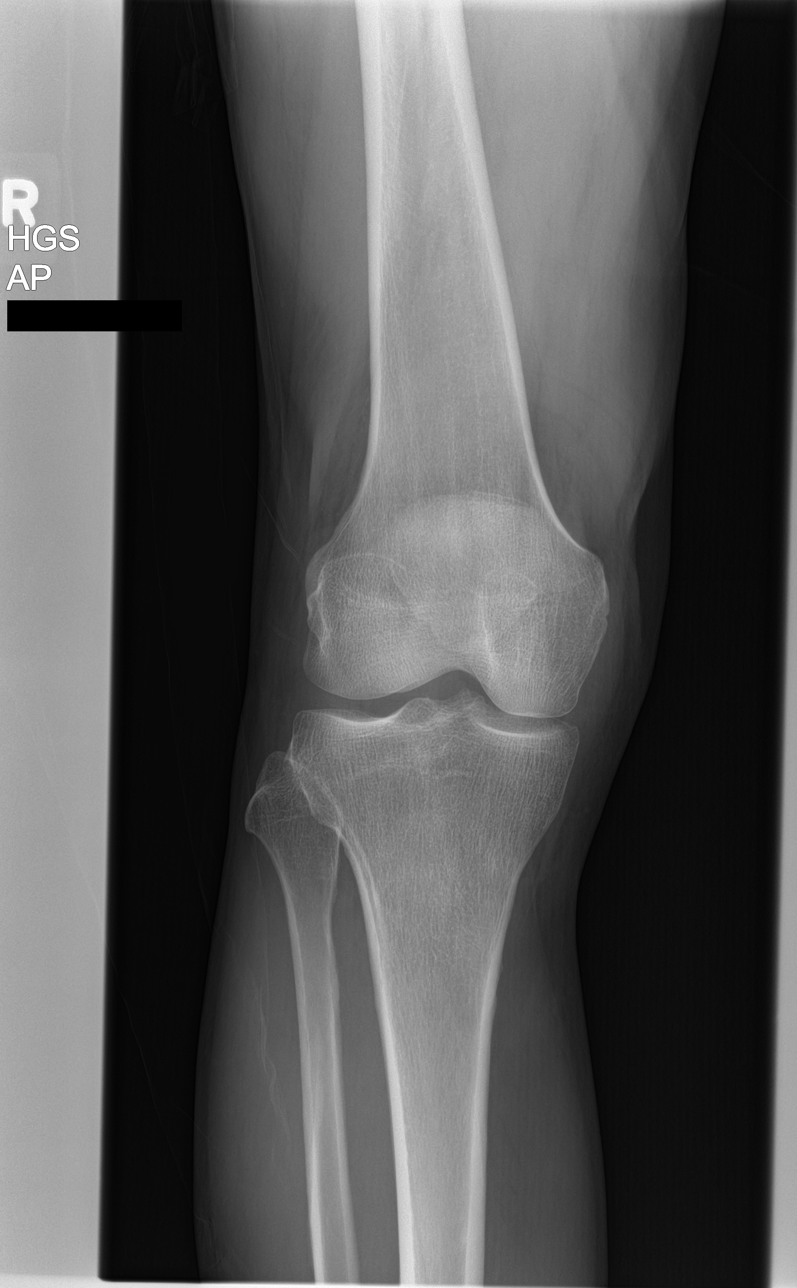
[im 4/5]
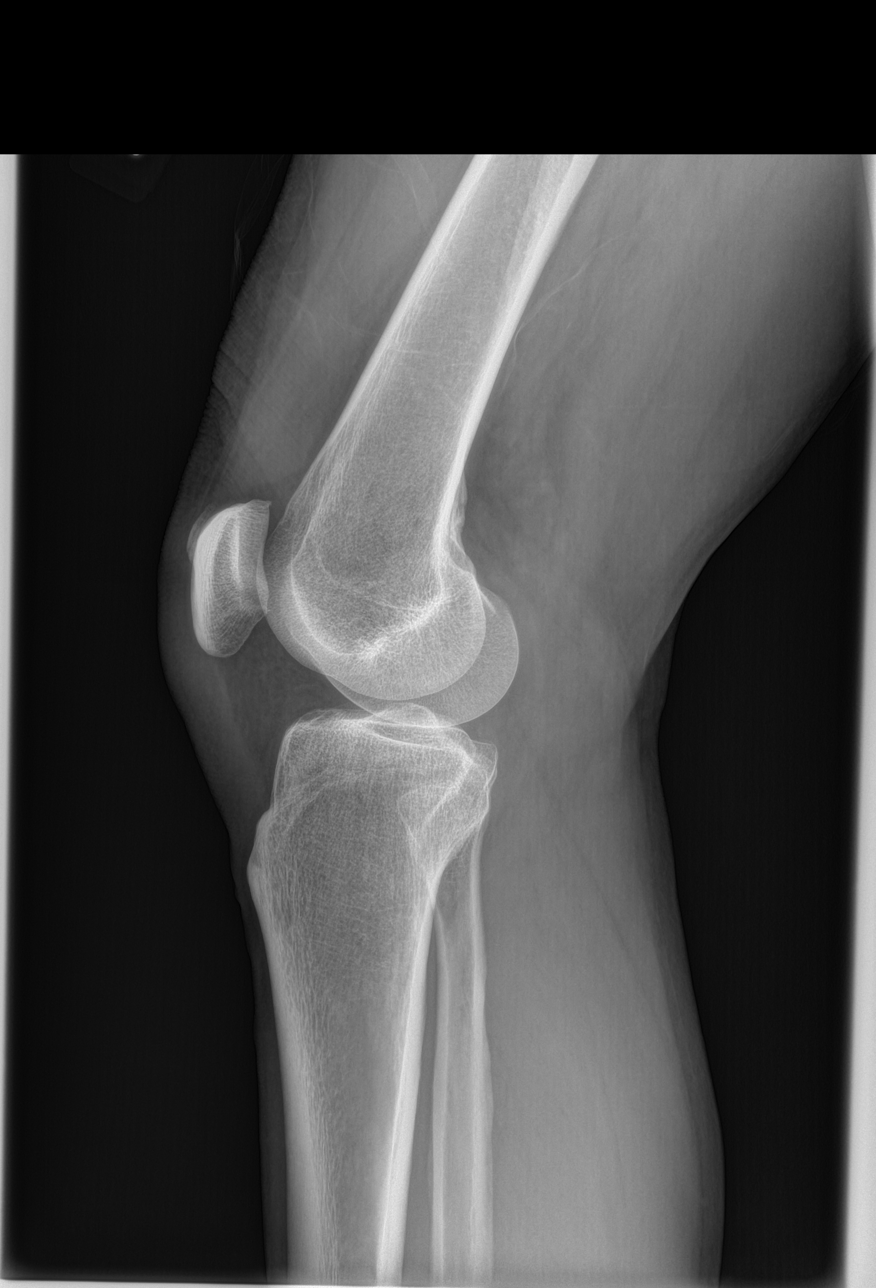
[im 5/5]
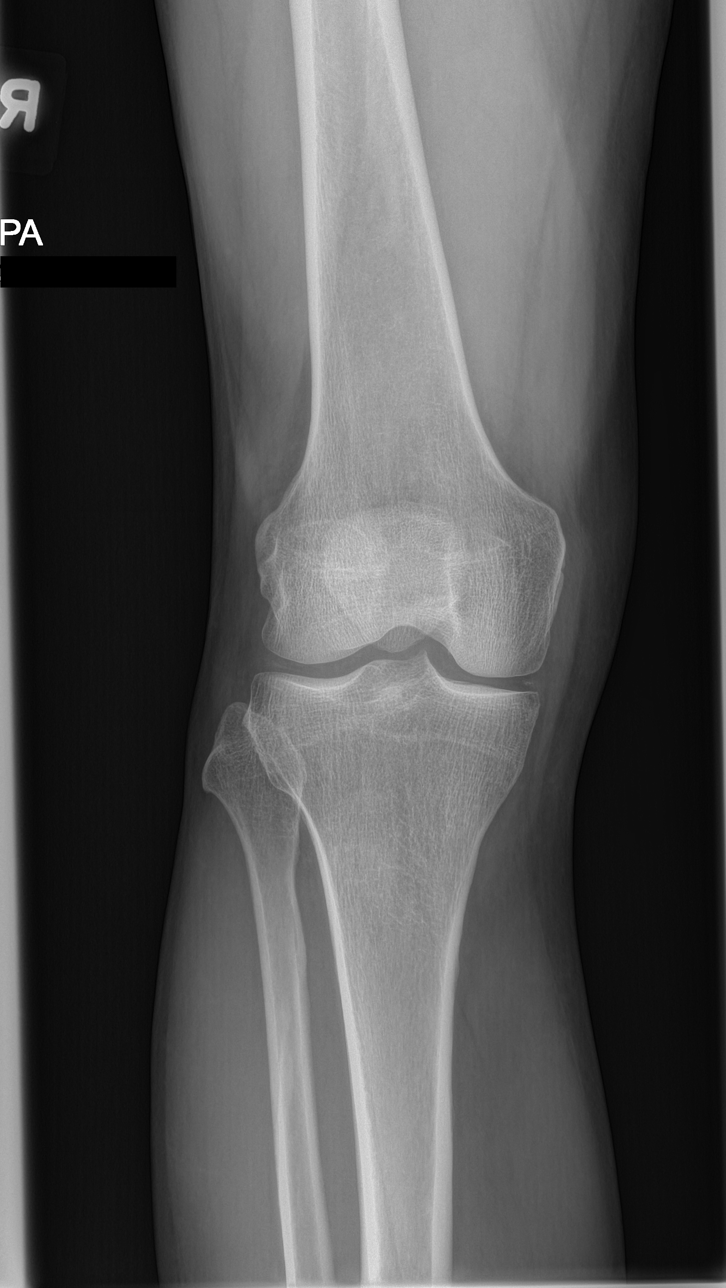

[5 of 5 positions shown; findings below may reference images not displayed]

FINDINGS: No evidence of fracture, dislocation, or joint effusion. Early
degenerative spurring seen involving the patella. No other signs of
arthropathy or other focal bone abnormality. Soft tissues are
unremarkable.
IMPRESSION: No acute findings.  Early patellar degenerative spurring.

## 2018-09-13 MED ORDER — OMEPRAZOLE 20 MG PO CPDR: 20.0000 mg | DELAYED_RELEASE_CAPSULE | Freq: Two times a day (BID) | ORAL | 3 refills | Status: DC

## 2018-09-13 NOTE — Progress Notes (Signed)
Patient: Michael [name]. Male    DOB: 01/12/1946   73 y.o.   MRN: [id number] Visit Date: 09/13/2018  Today's Provider: Wilhemena Durie, MD   Chief Complaint  Patient presents with  . Abdominal Pain   Subjective:     Abdominal Pain This is a new problem. The current episode started in the past 7 days. The problem occurs constantly. The problem has been gradually worsening. The pain is located in the generalized abdominal region. The pain is mild. The quality of the pain is aching and a sensation of fullness. The abdominal pain does not radiate. Associated symptoms include myalgias.   Patient reports that he has been lifting about 4lbs of water to tend to his tomato plants, and feel that he may have pulled a muscle. He also reports that he feels "full" in his abdomen.  He feels bloated and food seems to make the discomfort worse. He also c/o leg and hand cramps in the wake of recent heat wave. He is in his garden a lot. Allergies  Allergen Reactions  . Penicillins   . Sulfa Antibiotics   . Sulfamethoxazole-Trimethoprim      Current Outpatient Medications:  .  aspirin 81 MG EC tablet, Take by mouth., Disp: , Rfl:  .  atorvastatin (LIPITOR) 40 MG tablet, Take 1 tablet (40 mg total) by mouth at bedtime., Disp: 90 tablet, Rfl: 3 .  Denture Care Products (EFFERDENT DENTURE CLEANSER) TBEF, , Disp: , Rfl:  .  dutasteride (AVODART) 0.5 MG capsule, Take 1 capsule (0.5 mg total) by mouth daily., Disp: 90 capsule, Rfl: 1 .  flurbiprofen (ANSAID) 100 MG tablet, Take 1 tablet (100 mg total) by mouth 2 (two) times daily. As needed for back pain., Disp: 180 tablet, Rfl: 1 .  losartan-hydrochlorothiazide (HYZAAR) 50-12.5 MG tablet, Take 1 tablet by mouth daily., Disp: 90 tablet, Rfl: 3 .  omeprazole (PRILOSEC) 20 MG capsule, Take 1 capsule (20 mg total) by mouth daily., Disp: 90 capsule, Rfl: 3 .  sildenafil (REVATIO) 20 MG tablet, Take 1-5 tablets as needed 1 hour prior to  intercourse, Disp: 90 tablet, Rfl: 0 .  traZODone (DESYREL) 100 MG tablet, Take 2 tablets (200 mg total) by mouth daily., Disp: 180 tablet, Rfl: 3  Review of Systems  Constitutional: Negative for activity change and fatigue.  Eyes: Negative.   Respiratory: Negative for cough and shortness of breath.   Cardiovascular: Negative for chest pain, palpitations and leg swelling.  Gastrointestinal: Positive for abdominal pain.  Endocrine: Negative.   Genitourinary: Negative.   Musculoskeletal: Positive for myalgias.  Allergic/Immunologic: Negative.   Neurological: Negative for dizziness, light-headedness and numbness.  Psychiatric/Behavioral: Negative for agitation, self-injury, sleep disturbance and suicidal ideas. The patient is not nervous/anxious.     Social History   Tobacco Use  . Smoking status: Never Smoker  . Smokeless tobacco: Never Used  Substance Use Topics  . Alcohol use: No    Alcohol/week: 0.0 standard drinks      Objective:   BP 134/80   Pulse 64   Temp 98.3 F (36.8 C)   Resp 16   Wt 172 lb (78 kg)   SpO2 97%   BMI 26.15 kg/m  Vitals:   09/13/18 1013  BP: 134/80  Pulse: 64  Resp: 16  Temp: 98.3 F (36.8 C)  SpO2: 97%  Weight: 172 lb (78 kg)     Physical Exam Vitals signs reviewed.  Constitutional:  Appearance: He is well-developed.  HENT:     Head: Normocephalic and atraumatic.  Eyes:     General: No scleral icterus. Cardiovascular:     Rate and Rhythm: Normal rate and regular rhythm.     Heart sounds: Normal heart sounds.  Pulmonary:     Effort: Pulmonary effort is normal.     Breath sounds: Normal breath sounds.  Abdominal:     General: Bowel sounds are normal.     Palpations: Abdomen is soft. There is no mass.     Tenderness: There is abdominal tenderness in the right upper quadrant and periumbilical area.     Comments: Mild tenderness of RUQ extending down to right of umbilicus. Negative Murphys and Mcburneys  Skin:    General:  Skin is warm and dry.  Neurological:     General: No focal deficit present.     Mental Status: He is alert and oriented to person, place, and time.  Psychiatric:        Mood and Affect: Mood normal.        Behavior: Behavior normal.      No results found for any visits on 09/13/18.     Assessment & Plan    1. Gastroesophageal reflux disease, esophagitis presence not specified Double PPI More than 50% 25 minute visit spent in counseling or coordination of care  - omeprazole (PRILOSEC) 20 MG capsule; Take 1 capsule (20 mg total) by mouth 2 (two) times daily.  Dispense: 90 capsule; Refill: 3  2. Abdominal pain, unspecified abdominal location Obtain GB US. May need CT of abdomen if pain/tenderness persits although tenderness has no guarding or rebound. - CBC with Differential/Platelet - Comprehensive metabolic panel - US Abdomen Complete; Future  3. Anxiety   4. Elevated prostate specific antigen (PSA)   5. Severe obstructive sleep apnea  6.Myalgia Push fluids and try Mag Ox BID     Wilhemena Durie, MD  [facility]

## 2018-09-13 NOTE — Patient Instructions (Signed)
For leg and hand cramps increase fluids and take Magnesium Oxide twice a day.

## 2018-09-14 ENCOUNTER — Telehealth: Payer: Self-pay

## 2018-09-14 ENCOUNTER — Ambulatory Visit
Admission: RE | Admit: 2018-09-14 | Discharge: 2018-09-14 | Disposition: A | Discharge status: Home / Self Care | Payer: Medicare HMO | Source: Ambulatory Visit | Attending: Family Medicine | Admitting: Family Medicine

## 2018-09-14 ENCOUNTER — Other Ambulatory Visit: Payer: Self-pay

## 2018-09-14 DIAGNOSIS — R109 Unspecified abdominal pain: Secondary | ICD-10-CM | POA: Insufficient documentation

## 2018-09-14 LAB — COMPREHENSIVE METABOLIC PANEL
ALT: 18 IU/L (ref 0–44)
AST: 23 IU/L (ref 0–40)
Albumin/Globulin Ratio: 2.4 — ABNORMAL HIGH (ref 1.2–2.2)
Albumin: 4.6 g/dL (ref 3.8–4.8)
Alkaline Phosphatase: 65 IU/L (ref 39–117)
BUN/Creatinine Ratio: 16 (ref 10–24)
BUN: 20 mg/dL (ref 8–27)
Bilirubin Total: 0.7 mg/dL (ref 0.0–1.2)
CO2: 24 mmol/L (ref 20–29)
Calcium: 10 mg/dL (ref 8.6–10.2)
Chloride: 100 mmol/L (ref 96–106)
Creatinine, Ser: 1.24 mg/dL (ref 0.76–1.27)
GFR calc Af Amer: 68 mL/min/{1.73_m2} (ref >59)
GFR calc non Af Amer: 59 mL/min/{1.73_m2} — ABNORMAL LOW (ref >59)
Globulin, Total: 1.9 g/dL (ref 1.5–4.5)
Glucose: 103 mg/dL — ABNORMAL HIGH (ref 65–99)
Potassium: 4.2 mmol/L (ref 3.5–5.2)
Sodium: 143 mmol/L (ref 134–144)
Total Protein: 6.5 g/dL (ref 6.0–8.5)

## 2018-09-14 LAB — CBC WITH DIFFERENTIAL/PLATELET
Basophils Absolute: 0 10*3/uL (ref 0.0–0.2)
Basos: 0 %
EOS (ABSOLUTE): 0.1 10*3/uL (ref 0.0–0.4)
Eos: 1 %
Hematocrit: 45.7 % (ref 37.5–51.0)
Hemoglobin: 16.2 g/dL (ref 13.0–17.7)
Immature Grans (Abs): 0 10*3/uL (ref 0.0–0.1)
Immature Granulocytes: 0 %
Lymphocytes Absolute: 1.6 10*3/uL (ref 0.7–3.1)
Lymphs: 24 %
MCH: 31.6 pg (ref 26.6–33.0)
MCHC: 35.4 g/dL (ref 31.5–35.7)
MCV: 89 fL (ref 79–97)
Monocytes Absolute: 0.5 10*3/uL (ref 0.1–0.9)
Monocytes: 8 %
Neutrophils Absolute: 4.4 10*3/uL (ref 1.4–7.0)
Neutrophils: 67 %
Platelets: 231 10*3/uL (ref 150–450)
RBC: 5.13 x10E6/uL (ref 4.14–5.80)
RDW: 11.8 % (ref 11.6–15.4)
WBC: 6.6 10*3/uL (ref 3.4–10.8)

## 2018-09-14 NOTE — Telephone Encounter (Signed)
Ultrasound called to say his Abd [place] was negative.  The patient asked when he was there about concern over the appendix.  Tech told me that for appendix it needs to be an abd pelvic CT.  Patient wants to know if this is going to be ordered and if so when and can someone let him know.

## 2018-09-14 NOTE — Telephone Encounter (Signed)
-----   Message from Jerrol Banana., MD sent at 09/14/2018 11:30 AM EDT ----- Labs OK

## 2018-09-14 NOTE — Telephone Encounter (Signed)
-----   Message from Jerrol Banana., MD sent at 09/14/2018 11:31 AM EDT ----- Doren Custard ok--f/u Thurs am as planned unless pain worsens before then

## 2018-09-14 NOTE — Telephone Encounter (Signed)
LVMTRC 

## 2018-09-15 ENCOUNTER — Ambulatory Visit: Payer: Medicare HMO

## 2018-09-15 NOTE — Telephone Encounter (Signed)
Patient wife Chief Operating Officer per [facility]) was advised.

## 2018-09-16 ENCOUNTER — Encounter: Payer: Self-pay | Admitting: Family Medicine

## 2018-09-16 ENCOUNTER — Other Ambulatory Visit: Payer: Self-pay

## 2018-09-16 ENCOUNTER — Ambulatory Visit (INDEPENDENT_AMBULATORY_CARE_PROVIDER_SITE_OTHER): Payer: Medicare HMO | Admitting: Family Medicine

## 2018-09-16 VITALS — BP 150/76 | HR 67 | Temp 97.9°F | Resp 16 | Ht 68.0 in | Wt 172.0 lb

## 2018-09-16 DIAGNOSIS — K219 Gastro-esophageal reflux disease without esophagitis: Secondary | ICD-10-CM | POA: Diagnosis not present

## 2018-09-16 DIAGNOSIS — R1011 Right upper quadrant pain: Secondary | ICD-10-CM

## 2018-09-16 NOTE — Progress Notes (Signed)
Patient: Michael [name]. Male    DOB: March 29, 1945   73 y.o.   MRN: [id number] Visit Date: 09/16/2018  Today's Provider: Wilhemena Durie, MD   Chief Complaint  Patient presents with  . Abdominal Pain    follow up    Subjective:    HPI Patient comes in today for a follow up. He was seen in the office on 09/13/2018 for abdominal pain. He was advised to double omeprazole (take 40mg  total). He has also done his abdominal US, and it was WNL. He reports that he is feeling much better.   BP Readings from Last 3 Encounters:  09/16/18 (!) 150/76  09/13/18 134/80  08/05/18 (!) 152/80   Wt Readings from Last 3 Encounters:  09/16/18 172 lb (78 kg)  09/13/18 172 lb (78 kg)  08/05/18 172 lb (78 kg)    Allergies  Allergen Reactions  . Penicillins   . Sulfa Antibiotics   . Sulfamethoxazole-Trimethoprim      Current Outpatient Medications:  .  aspirin 81 MG EC tablet, Take by mouth., Disp: , Rfl:  .  atorvastatin (LIPITOR) 40 MG tablet, Take 1 tablet (40 mg total) by mouth at bedtime., Disp: 90 tablet, Rfl: 3 .  Denture Care Products (EFFERDENT DENTURE CLEANSER) TBEF, , Disp: , Rfl:  .  dutasteride (AVODART) 0.5 MG capsule, Take 1 capsule (0.5 mg total) by mouth daily., Disp: 90 capsule, Rfl: 1 .  flurbiprofen (ANSAID) 100 MG tablet, Take 1 tablet (100 mg total) by mouth 2 (two) times daily. As needed for back pain., Disp: 180 tablet, Rfl: 1 .  losartan-hydrochlorothiazide (HYZAAR) 50-12.5 MG tablet, Take 1 tablet by mouth daily., Disp: 90 tablet, Rfl: 3 .  omeprazole (PRILOSEC) 20 MG capsule, Take 1 capsule (20 mg total) by mouth 2 (two) times daily., Disp: 90 capsule, Rfl: 3 .  sildenafil (REVATIO) 20 MG tablet, Take 1-5 tablets as needed 1 hour prior to intercourse, Disp: 90 tablet, Rfl: 0 .  traZODone (DESYREL) 100 MG tablet, Take 2 tablets (200 mg total) by mouth daily., Disp: 180 tablet, Rfl: 3  Review of Systems  Constitutional: Negative for activity change, appetite  change, fatigue and unexpected weight change.  Gastrointestinal: Negative for abdominal distention, abdominal pain, blood in stool, constipation, diarrhea, nausea and vomiting.  Musculoskeletal: Positive for arthralgias.  Neurological: Negative for dizziness, light-headedness and headaches.    Social History   Tobacco Use  . Smoking status: Never Smoker  . Smokeless tobacco: Never Used  Substance Use Topics  . Alcohol use: No    Alcohol/week: 0.0 standard drinks      Objective:   BP (!) 150/76   Pulse 67   Temp 97.9 F (36.6 C)   Resp 16   Ht 5\' 8"  (1.727 m)   Wt 172 lb (78 kg)   SpO2 99%   BMI 26.15 kg/m  Vitals:   09/16/18 0821  BP: (!) 150/76  Pulse: 67  Resp: 16  Temp: 97.9 F (36.6 C)  SpO2: 99%  Weight: 172 lb (78 kg)  Height: 5\' 8"  (1.727 m)     Physical Exam Vitals signs reviewed.  Constitutional:      Appearance: He is well-developed.  HENT:     Head: Normocephalic and atraumatic.  Eyes:     General: No scleral icterus. Cardiovascular:     Rate and Rhythm: Normal rate and regular rhythm.     Heart sounds: Normal heart sounds.  Pulmonary:  Effort: Pulmonary effort is normal.     Breath sounds: Normal breath sounds.  Abdominal:     General: Bowel sounds are normal.     Palpations: Abdomen is soft. There is no mass.     Tenderness: There is abdominal tenderness in the right upper quadrant and periumbilical area.     Comments: Minimal  tenderness of RUQ extending down to right of umbilicus. Negative Murphys and Mcburneys There is no guarding or rebound.  Skin:    General: Skin is warm and dry.  Neurological:     General: No focal deficit present.     Mental Status: He is alert and oriented to person, place, and time.  Psychiatric:        Mood and Affect: Mood normal.        Behavior: Behavior normal.      No results found for any visits on 09/16/18.     Assessment & Plan    1. Gastroesophageal reflux disease, esophagitis presence  not specified Improved - omeprazole (PRILOSEC) 20 MG capsule; Take 1 capsule (20 mg total) by mouth 2 (two) times daily.  Dispense: 180 capsule; Refill: 3  2. Right upper quadrant abdominal pain Much improved. Work up with CT if it recurs or worsens.     Richard Cranford Mon, MD  [facility]

## 2018-09-17 MED ORDER — OMEPRAZOLE 20 MG PO CPDR: 20.0000 mg | DELAYED_RELEASE_CAPSULE | Freq: Two times a day (BID) | ORAL | 3 refills | Status: DC

## 2018-09-21 ENCOUNTER — Other Ambulatory Visit: Payer: Self-pay | Admitting: *Deleted

## 2018-09-21 DIAGNOSIS — N5201 Erectile dysfunction due to arterial insufficiency: Secondary | ICD-10-CM

## 2018-09-21 MED ORDER — SILDENAFIL CITRATE 20 MG PO TABS: ORAL_TABLET | ORAL | 0 refills | Status: DC

## 2018-09-24 ENCOUNTER — Other Ambulatory Visit: Payer: Self-pay | Admitting: Family Medicine

## 2018-09-24 DIAGNOSIS — L570 Actinic keratosis: Secondary | ICD-10-CM | POA: Diagnosis not present

## 2018-09-24 DIAGNOSIS — L821 Other seborrheic keratosis: Secondary | ICD-10-CM | POA: Diagnosis not present

## 2018-09-24 DIAGNOSIS — L820 Inflamed seborrheic keratosis: Secondary | ICD-10-CM | POA: Diagnosis not present

## 2018-09-24 DIAGNOSIS — D0461 Carcinoma in situ of skin of right upper limb, including shoulder: Secondary | ICD-10-CM | POA: Diagnosis not present

## 2018-09-24 DIAGNOSIS — D2261 Melanocytic nevi of right upper limb, including shoulder: Secondary | ICD-10-CM | POA: Diagnosis not present

## 2018-09-24 DIAGNOSIS — D485 Neoplasm of uncertain behavior of skin: Secondary | ICD-10-CM | POA: Diagnosis not present

## 2018-09-24 DIAGNOSIS — D2262 Melanocytic nevi of left upper limb, including shoulder: Secondary | ICD-10-CM | POA: Diagnosis not present

## 2018-09-24 DIAGNOSIS — F419 Anxiety disorder, unspecified: Secondary | ICD-10-CM

## 2018-09-24 DIAGNOSIS — C44622 Squamous cell carcinoma of skin of right upper limb, including shoulder: Secondary | ICD-10-CM | POA: Diagnosis not present

## 2018-09-24 DIAGNOSIS — L538 Other specified erythematous conditions: Secondary | ICD-10-CM | POA: Diagnosis not present

## 2018-09-24 DIAGNOSIS — D2272 Melanocytic nevi of left lower limb, including hip: Secondary | ICD-10-CM | POA: Diagnosis not present

## 2018-09-24 DIAGNOSIS — Z85828 Personal history of other malignant neoplasm of skin: Secondary | ICD-10-CM | POA: Diagnosis not present

## 2018-09-27 ENCOUNTER — Ambulatory Visit: Payer: Self-pay | Admitting: Family Medicine

## 2018-10-18 ENCOUNTER — Other Ambulatory Visit: Payer: Self-pay

## 2018-10-18 ENCOUNTER — Ambulatory Visit (INDEPENDENT_AMBULATORY_CARE_PROVIDER_SITE_OTHER): Payer: Medicare HMO | Admitting: Family Medicine

## 2018-10-18 ENCOUNTER — Encounter: Payer: Self-pay | Admitting: Family Medicine

## 2018-10-18 VITALS — BP 122/62 | HR 66 | Temp 97.7°F | Wt 173.4 lb

## 2018-10-18 DIAGNOSIS — R1310 Dysphagia, unspecified: Secondary | ICD-10-CM | POA: Diagnosis not present

## 2018-10-18 DIAGNOSIS — R1013 Epigastric pain: Secondary | ICD-10-CM | POA: Diagnosis not present

## 2018-10-18 DIAGNOSIS — R1011 Right upper quadrant pain: Secondary | ICD-10-CM | POA: Diagnosis not present

## 2018-10-18 NOTE — Progress Notes (Signed)
Patient: Michael [name]. Male    DOB: [birth year]   73 y.o.   MRN: [id number] Visit Date: 10/18/2018  Today's Provider: Vernie Murders, PA   Chief Complaint  Patient presents with  . Gastroesophageal Reflux    1 month follow up   Subjective:     Gastroesophageal Reflux He complains of dysphagia. He reports no abdominal pain, no belching, no chest pain, no choking, no coughing, no early satiety, no globus sensation, no heartburn, no hoarse voice, no nausea, no sore throat, no stridor, no tooth decay, no water brash or no wheezing. This is a chronic problem. The problem occurs occasionally. The problem has been gradually improving. The symptoms are aggravated by certain foods. Pertinent negatives include no anemia, melena, muscle weakness or orthopnea. He has tried a PPI for the symptoms. The treatment provided significant relief.  History reviewed. No pertinent past medical history. Patient Active Problem List   Diagnosis Date Noted  . Erectile dysfunction due to arterial insufficiency 07/16/2017  . Low back ache 11/16/2014  . Anxiety 08/21/2014  . Abnormal finding on thyroid function test 08/21/2014  . BPH without obstruction/lower urinary tract symptoms 08/21/2014  . ED (erectile dysfunction) of organic origin 08/21/2014  . Exposure to viral hepatitis 08/21/2014  . Hypercholesteremia 08/21/2014  . Benign hypertension 08/21/2014  . Severe obstructive sleep apnea 08/21/2014  . Sleep disturbance 08/21/2014  . Elevated prostate specific antigen (PSA) 10/21/2011   Past Surgical History:  Procedure Laterality Date  . AMPUTATION FINGER Left 1965   5TH DIGIT, INDUSTRIAL ACCIDENT  . PROSTATE BIOPSY  03/1998   NEGATIVE   Family History  Problem Relation Age of Onset  . Emphysema Mother   . Diabetes Maternal Uncle    Allergies  Allergen Reactions  . Penicillins   . Sulfa Antibiotics   . Sulfamethoxazole-Trimethoprim     Current Outpatient Medications:  .  aspirin  81 MG EC tablet, Take by mouth., Disp: , Rfl:  .  atorvastatin (LIPITOR) 40 MG tablet, Take 1 tablet (40 mg total) by mouth at bedtime., Disp: 90 tablet, Rfl: 3 .  Denture Care Products (EFFERDENT DENTURE CLEANSER) TBEF, , Disp: , Rfl:  .  dutasteride (AVODART) 0.5 MG capsule, Take 1 capsule (0.5 mg total) by mouth daily., Disp: 90 capsule, Rfl: 1 .  losartan-hydrochlorothiazide (HYZAAR) 50-12.5 MG tablet, Take 1 tablet by mouth daily., Disp: 90 tablet, Rfl: 3 .  Magnesium 400 MG TABS, Take by mouth., Disp: , Rfl:  .  omeprazole (PRILOSEC) 20 MG capsule, Take 1 capsule (20 mg total) by mouth 2 (two) times daily., Disp: 180 capsule, Rfl: 3 .  sildenafil (REVATIO) 20 MG tablet, Take 1-5 tablets as needed 1 hour prior to intercourse, Disp: 90 tablet, Rfl: 0 .  traZODone (DESYREL) 100 MG tablet, TAKE 2 TABLETS EVERY DAY, Disp: 180 tablet, Rfl: 3 .  flurbiprofen (ANSAID) 100 MG tablet, Take 1 tablet (100 mg total) by mouth 2 (two) times daily. As needed for back pain. (Patient not taking: Reported on 10/18/2018), Disp: 180 tablet, Rfl: 1  Review of Systems  HENT: Negative for hoarse voice and sore throat.   Respiratory: Negative for cough, choking and wheezing.   Cardiovascular: Negative for chest pain.  Gastrointestinal: Positive for dysphagia. Negative for abdominal pain, heartburn, melena and nausea.  Musculoskeletal: Negative for muscle weakness.   Social History   Tobacco Use  . Smoking status: Never Smoker  . Smokeless tobacco: Never Used  Substance Use Topics  .  Alcohol use: No    Alcohol/week: 0.0 standard drinks     Objective:   BP 122/62   Pulse 66   Temp 97.7 F (36.5 C) (Oral)   Wt 173 lb 6.4 oz (78.7 kg)   SpO2 99%   BMI 26.37 kg/m  Vitals:   10/18/18 0818  BP: 122/62  Pulse: 66  Temp: 97.7 F (36.5 C)  TempSrc: Oral  SpO2: 99%  Weight: 173 lb 6.4 oz (78.7 kg)   Physical Exam Constitutional:      General: He is not in acute distress.    Appearance: He is  well-developed.  HENT:     Head: Normocephalic and atraumatic.     Right Ear: Hearing normal.     Left Ear: Hearing normal.     Nose: Nose normal.  Eyes:     General: Lids are normal. No scleral icterus.       Right eye: No discharge.        Left eye: No discharge.     Conjunctiva/sclera: Conjunctivae normal.  Neck:     Musculoskeletal: Neck supple.  Cardiovascular:     Rate and Rhythm: Normal rate and regular rhythm.     Heart sounds: Normal heart sounds.  Pulmonary:     Effort: Pulmonary effort is normal. No respiratory distress.  Abdominal:     General: Bowel sounds are normal.     Palpations: Abdomen is soft. There is no mass.     Tenderness: There is no abdominal tenderness. There is no right CVA tenderness or left CVA tenderness.  Musculoskeletal: Normal range of motion.  Skin:    Findings: No lesion or rash.  Neurological:     Mental Status: He is alert and oriented to person, place, and time.  Psychiatric:        Speech: Speech normal.        Behavior: Behavior normal.        Thought Content: Thought content normal.       Assessment & Plan    1. Right upper quadrant abdominal pain Resolved after taking the Prilosec 20 mg BID for the past couple weeks. No nausea, vomiting, diarrhea, gas or jaundice. May continue Prilosec prn and recheck if any symptoms return.  2. Dysphagia, unspecified type Changed eating speed and dysphagia stopped. Suspect esophageal spasms. Not ready to consider GI referral to rule out esophageal stricture. Should recheck if return of symptoms.  3. Dyspepsia No further heartburn. No melena or hematemesis. May use Prilosec 20 mg 1-2 times a day prn. Restrict spicy and greasy foods. Limit caffeine consumption. Denies ETOH use or smoking.     Vernie Murders, PA  [facility]

## 2018-11-08 DIAGNOSIS — C44622 Squamous cell carcinoma of skin of right upper limb, including shoulder: Secondary | ICD-10-CM | POA: Diagnosis not present

## 2018-11-22 DIAGNOSIS — L570 Actinic keratosis: Secondary | ICD-10-CM | POA: Diagnosis not present

## 2018-11-22 DIAGNOSIS — X32XXXA Exposure to sunlight, initial encounter: Secondary | ICD-10-CM | POA: Diagnosis not present

## 2018-11-22 DIAGNOSIS — D0461 Carcinoma in situ of skin of right upper limb, including shoulder: Secondary | ICD-10-CM | POA: Diagnosis not present

## 2019-02-22 ENCOUNTER — Other Ambulatory Visit: Payer: Self-pay | Admitting: Family Medicine

## 2019-02-22 DIAGNOSIS — E7800 Pure hypercholesterolemia, unspecified: Secondary | ICD-10-CM

## 2019-02-22 DIAGNOSIS — I10 Essential (primary) hypertension: Secondary | ICD-10-CM

## 2019-02-22 MED ORDER — ATORVASTATIN CALCIUM 40 MG PO TABS: 40.0000 mg | ORAL_TABLET | Freq: Every day | ORAL | 3 refills | Status: DC

## 2019-02-22 NOTE — Telephone Encounter (Signed)
[facility] faxed refill request for the following medications:  atorvastatin (LIPITOR) 40 MG tablet   Please advise.

## 2019-02-23 DIAGNOSIS — L821 Other seborrheic keratosis: Secondary | ICD-10-CM | POA: Diagnosis not present

## 2019-02-23 DIAGNOSIS — D2271 Melanocytic nevi of right lower limb, including hip: Secondary | ICD-10-CM | POA: Diagnosis not present

## 2019-02-23 DIAGNOSIS — X32XXXA Exposure to sunlight, initial encounter: Secondary | ICD-10-CM | POA: Diagnosis not present

## 2019-02-23 DIAGNOSIS — L700 Acne vulgaris: Secondary | ICD-10-CM | POA: Diagnosis not present

## 2019-02-23 DIAGNOSIS — D225 Melanocytic nevi of trunk: Secondary | ICD-10-CM | POA: Diagnosis not present

## 2019-02-23 DIAGNOSIS — Z85828 Personal history of other malignant neoplasm of skin: Secondary | ICD-10-CM | POA: Diagnosis not present

## 2019-02-23 DIAGNOSIS — L570 Actinic keratosis: Secondary | ICD-10-CM | POA: Diagnosis not present

## 2019-02-23 NOTE — Telephone Encounter (Signed)
Requested Prescriptions  Pending Prescriptions Disp Refills  . losartan-hydrochlorothiazide (HYZAAR) 50-12.5 MG tablet [Pharmacy Med Name: LOSARTAN POTASSIUM/HYDROCHLOROTHIAZIDE 50-12.5 MG Tablet] 90 tablet 3    Sig: TAKE 1 TABLET EVERY DAY     Cardiovascular: ARB + Diuretic Combos Passed - 02/22/2019  7:08 PM      Passed - K in normal range and within 180 days    Potassium  Date Value Ref Range Status  09/13/2018 4.2 3.5 - 5.2 mmol/L Final         Passed - Na in normal range and within 180 days    Sodium  Date Value Ref Range Status  09/13/2018 143 134 - 144 mmol/L Final         Passed - Cr in normal range and within 180 days    Creat  Date Value Ref Range Status  12/09/2016 1.09 0.70 - 1.25 mg/dL Final    Comment:    For patients >3 years of age, the reference limit for Creatinine is approximately 13% higher for people identified as African-American. .    Creatinine, Ser  Date Value Ref Range Status  09/13/2018 1.24 0.76 - 1.27 mg/dL Final         Passed - Ca in normal range and within 180 days    Calcium  Date Value Ref Range Status  09/13/2018 10.0 8.6 - 10.2 mg/dL Final         Passed - Patient is not pregnant      Passed - Last BP in normal range    BP Readings from Last 1 Encounters:  10/18/18 122/62         Passed - Valid encounter within last 6 months    Recent Outpatient Visits          4 months ago Right upper quadrant abdominal pain   [place], [place]   5 months ago Right upper quadrant abdominal pain   [facility] Jerrol Banana., MD   5 months ago Abdominal pain, unspecified abdominal location   [facility] Jerrol Banana., MD   6 months ago Dysphagia, unspecified type   [place], [place]   11 months ago Dysfunction of right eustachian tube   [place], Vickki Muff, [place]      Future Appointments            In  4 months [place], [place], [place]

## 2019-04-04 ENCOUNTER — Ambulatory Visit: Payer: Medicare HMO | Attending: Internal Medicine

## 2019-04-04 DIAGNOSIS — Z23 Encounter for immunization: Secondary | ICD-10-CM | POA: Insufficient documentation

## 2019-04-04 NOTE — Progress Notes (Signed)
   [id number]  Name:  Michael [name].    MRN: [id number] DOB: February 23, 1946  04/04/2019  [name] was observed post Covid-19 immunization for 15 minutes without incidence. He was provided with Vaccine Information Sheet and instruction to access the V-Safe system.   [name] was instructed to call 911 with any severe reactions post vaccine: [name] Kitchen Difficulty breathing  . Swelling of your face and throat  . A fast heartbeat  . A bad rash all over your body  . Dizziness and weakness    Immunizations Administered    Name Date Dose VIS Date Route   Pfizer COVID-19 Vaccine 04/04/2019 11:37 AM 0.3 mL 02/04/2019 Intramuscular   Manufacturer: [place]   Lot: [id number]   [place]: [id number]

## 2019-04-29 ENCOUNTER — Ambulatory Visit: Payer: Medicare HMO

## 2019-04-30 ENCOUNTER — Ambulatory Visit: Payer: Medicare HMO | Attending: Internal Medicine

## 2019-04-30 DIAGNOSIS — Z23 Encounter for immunization: Secondary | ICD-10-CM | POA: Insufficient documentation

## 2019-04-30 NOTE — Progress Notes (Signed)
   [id number] [facility]  Name:  Michael [name].    MRN: [id number] DOB: 04-Sep-1945  04/30/2019  Michael [name] was observed post Covid-19 immunization for 15 minutes without incident. He was provided with Vaccine Information Sheet and instruction to access the V-Safe system.   Michael [name] was instructed to call 911 with any severe reactions post vaccine: [name] Kitchen Difficulty breathing  . Swelling of face and throat  . A fast heartbeat  . A bad rash all over body  . Dizziness and weakness   Immunizations Administered    Name Date Dose VIS Date Route   Pfizer COVID-19 Vaccine 04/30/2019  8:34 AM 0.3 mL 02/04/2019 Intramuscular   Manufacturer: [name]   Lot: [id number]   [name]: [id number]

## 2019-06-13 ENCOUNTER — Other Ambulatory Visit: Payer: Self-pay | Admitting: Urology

## 2019-06-13 DIAGNOSIS — N5201 Erectile dysfunction due to arterial insufficiency: Secondary | ICD-10-CM

## 2019-07-19 ENCOUNTER — Other Ambulatory Visit: Payer: Self-pay

## 2019-07-19 ENCOUNTER — Other Ambulatory Visit: Payer: Medicare HMO

## 2019-07-19 DIAGNOSIS — R972 Elevated prostate specific antigen [PSA]: Secondary | ICD-10-CM

## 2019-07-20 LAB — PSA: Prostate Specific Ag, Serum: 2.1 ng/mL (ref 0.0–4.0)

## 2019-07-21 ENCOUNTER — Ambulatory Visit: Payer: Medicare HMO | Admitting: Urology

## 2019-07-28 ENCOUNTER — Encounter: Payer: Self-pay | Admitting: Urology

## 2019-07-28 ENCOUNTER — Ambulatory Visit: Payer: Medicare HMO | Admitting: Urology

## 2019-07-28 ENCOUNTER — Other Ambulatory Visit: Payer: Self-pay

## 2019-07-28 VITALS — BP 133/73 | HR 83 | Ht 66.0 in | Wt 175.0 lb

## 2019-07-28 DIAGNOSIS — N400 Enlarged prostate without lower urinary tract symptoms: Secondary | ICD-10-CM

## 2019-07-28 DIAGNOSIS — N5201 Erectile dysfunction due to arterial insufficiency: Secondary | ICD-10-CM | POA: Diagnosis not present

## 2019-07-28 DIAGNOSIS — R972 Elevated prostate specific antigen [PSA]: Secondary | ICD-10-CM

## 2019-07-28 DIAGNOSIS — N529 Male erectile dysfunction, unspecified: Secondary | ICD-10-CM | POA: Diagnosis not present

## 2019-07-28 MED ORDER — SILDENAFIL CITRATE 20 MG PO TABS: ORAL_TABLET | ORAL | 0 refills | Status: DC

## 2019-07-28 NOTE — Progress Notes (Signed)
07/28/2019 11:25 AM   Michael [name]. 1945-11-08 [id number]  Referring provider: Margo Common, [place],  [place] [address]  Chief Complaint  Patient presents with   Elevated PSA    Urologic problem list:   -Elevated PSA; prostate biopsy 2000 for PSA of 4.6 with benign pathology -BPH with lower urinary tract symptoms; on dutasteride 3 times weekly -Erectile dysfunction; generic sildenafil   HPI: 74 y.o. male presents for annual follow-up.  -Since last years visit he continues to do well. -No bothersome LUTS; remains on dutasteride -Denies dysuria, gross hematuria -Denies flank, abdominal or pelvic pain -PSA 07/19/2019 stable at 3.1 (uncorrected) -Sildenafil effective 60-80 mg   PMH: No past medical history on file.  Surgical History: Past Surgical History:  Procedure Laterality Date   AMPUTATION FINGER Left 1965   5TH DIGIT, INDUSTRIAL ACCIDENT   PROSTATE BIOPSY  03/1998   NEGATIVE    Home Medications:  Allergies as of 07/28/2019      Reactions   Penicillins    Sulfa Antibiotics    Sulfamethoxazole-trimethoprim       Medication List       Accurate as of July 28, 2019 11:25 AM. If you have any questions, ask your nurse or doctor.        aspirin 81 MG EC tablet Take by mouth.   atorvastatin 40 MG tablet Commonly known as: LIPITOR Take 1 tablet (40 mg total) by mouth at bedtime.   dutasteride 0.5 MG capsule Commonly known as: Avodart Take 1 capsule (0.5 mg total) by mouth daily.   Efferdent Denture Cleanser Tbef   Fish Oil 1000 MG Caps Take by mouth.   flurbiprofen 100 MG tablet Commonly known as: ANSAID Take 1 tablet (100 mg total) by mouth 2 (two) times daily. As needed for back pain.   losartan-hydrochlorothiazide 50-12.5 MG tablet Commonly known as: HYZAAR TAKE 1 TABLET EVERY DAY   Magnesium 400 MG Tabs Take by mouth.   omeprazole 20 MG capsule Commonly known as: PRILOSEC Take 1 capsule (20 mg  total) by mouth 2 (two) times daily.   sildenafil 20 MG tablet Commonly known as: REVATIO TAKE 1 TO 5 TABLETS AS NEEDED 1 HOUR PRIOR TO INTERCOURSE   traZODone 100 MG tablet Commonly known as: DESYREL TAKE 2 TABLETS EVERY DAY       Allergies:  Allergies  Allergen Reactions   Penicillins    Sulfa Antibiotics    Sulfamethoxazole-Trimethoprim     Family History: Family History  Problem Relation Age of Onset   Emphysema Mother    Diabetes Maternal Uncle     Social History:  reports that he has never smoked. He has never used smokeless tobacco. He reports that he does not drink alcohol or use drugs.   Physical Exam: BP 133/73    Pulse 83    Ht 5\' 6"  (1.676 m)    Wt 175 lb (79.4 kg)    BMI 28.25 kg/m   Constitutional:  Alert and oriented, No acute distress. HEENT: [facility] AT, moist mucus membranes.  Trachea midline, no masses. Cardiovascular: No clubbing, cyanosis, or edema. Respiratory: Normal respiratory effort, no increased work of breathing. GU: Prostate 50 g, smooth without nodules Skin: No rashes, bruises or suspicious lesions. Neurologic: Grossly intact, no focal deficits, moving all 4 extremities. Psychiatric: Normal mood and affect.   Assessment & Plan:    1.  Elevated PSA -Stable PSA.  We discussed current prostate cancer screening guidelines with recommendations between  the ages of 106-69.  We also discussed that in healthy patients this can be extended to age 63 and he desires to continue prostate cancer screening.  2.  BPH with LUTS -No significant lower urinary tract symptoms on dutasteride which she will continue  3.  Erectile dysfunction Stable on sildenafil.  Refill sent to pharmacy.   Abbie Sons, [place] [address], [place], [place] [address] [phone]

## 2019-08-23 ENCOUNTER — Encounter: Payer: Self-pay | Admitting: Family Medicine

## 2019-08-23 ENCOUNTER — Ambulatory Visit (INDEPENDENT_AMBULATORY_CARE_PROVIDER_SITE_OTHER): Payer: Medicare HMO | Admitting: Family Medicine

## 2019-08-23 ENCOUNTER — Other Ambulatory Visit: Payer: Self-pay

## 2019-08-23 VITALS — BP 128/70 | HR 82 | Temp 97.5°F | Resp 16 | Wt 180.6 lb

## 2019-08-23 DIAGNOSIS — F419 Anxiety disorder, unspecified: Secondary | ICD-10-CM | POA: Diagnosis not present

## 2019-08-23 DIAGNOSIS — R252 Cramp and spasm: Secondary | ICD-10-CM | POA: Diagnosis not present

## 2019-08-23 DIAGNOSIS — E7800 Pure hypercholesterolemia, unspecified: Secondary | ICD-10-CM | POA: Diagnosis not present

## 2019-08-23 DIAGNOSIS — I10 Essential (primary) hypertension: Secondary | ICD-10-CM

## 2019-08-23 NOTE — Patient Instructions (Addendum)
Hypertension, Adult High blood pressure (hypertension) is when the force of blood pumping through the arteries is too strong. The arteries are the blood vessels that carry blood from the heart throughout the body. Hypertension forces the heart to work harder to pump blood and may cause arteries to become narrow or stiff. Untreated or uncontrolled hypertension can cause a heart attack, heart failure, a stroke, kidney disease, and other problems. A blood pressure reading consists of a higher number over a lower number. Ideally, your blood pressure should be below 120/80. The first ("top") number is called the systolic pressure. It is a measure of the pressure in your arteries as your heart beats. The second ("bottom") number is called the diastolic pressure. It is a measure of the pressure in your arteries as the heart relaxes. What are the causes? The exact cause of this condition is not known. There are some conditions that result in or are related to high blood pressure. What increases the risk? Some risk factors for high blood pressure are under your control. The following factors may make you more likely to develop this condition:  Smoking.  Having type 2 diabetes mellitus, high cholesterol, or both.  Not getting enough exercise or physical activity.  Being overweight.  Having too much fat, sugar, calories, or salt (sodium) in your diet.  Drinking too much alcohol. Some risk factors for high blood pressure may be difficult or impossible to change. Some of these factors include:  Having chronic kidney disease.  Having a family history of high blood pressure.  Age. Risk increases with age.  Race. You may be at higher risk if you are African American.  Gender. Men are at higher risk than women before age 45. After age 65, women are at higher risk than men.  Having obstructive sleep apnea.  Stress. What are the signs or symptoms? High blood pressure may not cause symptoms. Very high  blood pressure (hypertensive crisis) may cause:  Headache.  Anxiety.  Shortness of breath.  Nosebleed.  Nausea and vomiting.  Vision changes.  Severe chest pain.  Seizures. How is this diagnosed? This condition is diagnosed by measuring your blood pressure while you are seated, with your arm resting on a flat surface, your legs uncrossed, and your feet flat on the floor. The cuff of the blood pressure monitor will be placed directly against the skin of your upper arm at the level of your heart. It should be measured at least twice using the same arm. Certain conditions can cause a difference in blood pressure between your right and left arms. Certain factors can cause blood pressure readings to be lower or higher than normal for a short period of time:  When your blood pressure is higher when you are in a health care provider's office than when you are at home, this is called white coat hypertension. Most people with this condition do not need medicines.  When your blood pressure is higher at home than when you are in a health care provider's office, this is called masked hypertension. Most people with this condition may need medicines to control blood pressure. If you have a high blood pressure reading during one visit or you have normal blood pressure with other risk factors, you may be asked to:  Return on a different day to have your blood pressure checked again.  Monitor your blood pressure at home for 1 week or longer. If you are diagnosed with hypertension, you may have other blood or   imaging tests to help your health care provider understand your overall risk for other conditions. How is this treated? This condition is treated by making healthy lifestyle changes, such as eating healthy foods, exercising more, and reducing your alcohol intake. Your health care provider may prescribe medicine if lifestyle changes are not enough to get your blood pressure under control, and  if:  Your systolic blood pressure is above 130.  Your diastolic blood pressure is above 80. Your personal target blood pressure may vary depending on your medical conditions, your age, and other factors. Follow these instructions at home: Eating and drinking   Eat a diet that is high in fiber and potassium, and low in sodium, added sugar, and fat. An example eating plan is called the DASH (Dietary Approaches to Stop Hypertension) diet. To eat this way: ? Eat plenty of fresh fruits and vegetables. Try to fill one half of your plate at each meal with fruits and vegetables. ? Eat whole grains, such as whole-wheat pasta, brown rice, or whole-grain bread. Fill about one fourth of your plate with whole grains. ? Eat or drink low-fat dairy products, such as skim milk or low-fat yogurt. ? Avoid fatty cuts of meat, processed or cured meats, and poultry with skin. Fill about one fourth of your plate with lean proteins, such as fish, chicken without skin, beans, eggs, or tofu. ? Avoid pre-made and processed foods. These tend to be higher in sodium, added sugar, and fat.  Reduce your daily sodium intake. Most people with hypertension should eat less than 1,500 mg of sodium a day.  Do not drink alcohol if: ? Your health care provider tells you not to drink. ? You are pregnant, may be pregnant, or are planning to become pregnant.  If you drink alcohol: ? Limit how much you use to:  0-1 drink a day for women.  0-2 drinks a day for men. ? Be aware of how much alcohol is in your drink. In the [place] one drink equals one 12 oz bottle of beer (355 mL), one 5 oz glass of wine (148 mL), or one 1 oz glass of hard liquor (44 mL). Lifestyle   Work with your health care provider to maintain a healthy body weight or to lose weight. Ask what an ideal weight is for you.  Get at least 30 minutes of exercise most days of the week. Activities may include walking, swimming, or biking.  Include exercise to  strengthen your muscles (resistance exercise), such as Pilates or lifting weights, as part of your weekly exercise routine. Try to do these types of exercises for 30 minutes at least 3 days a week.  Do not use any products that contain nicotine or tobacco, such as cigarettes, e-cigarettes, and chewing tobacco. If you need help quitting, ask your health care provider.  Monitor your blood pressure at home as told by your health care provider.  Keep all follow-up visits as told by your health care provider. This is important. Medicines  Take over-the-counter and prescription medicines only as told by your health care provider. Follow directions carefully. Blood pressure medicines must be taken as prescribed.  Do not skip doses of blood pressure medicine. Doing this puts you at risk for problems and can make the medicine less effective.  Ask your health care provider about side effects or reactions to medicines that you should watch for. Contact a health care provider if you:  Think you are having a reaction to a medicine you   are taking.  Have headaches that keep coming back (recurring).  Feel dizzy.  Have swelling in your ankles.  Have trouble with your vision. Get help right away if you:  Develop a severe headache or confusion.  Have unusual weakness or numbness.  Feel faint.  Have severe pain in your chest or abdomen.  Vomit repeatedly.  Have trouble breathing. Summary  Hypertension is when the force of blood pumping through your arteries is too strong. If this condition is not controlled, it may put you at risk for serious complications.  Your personal target blood pressure may vary depending on your medical conditions, your age, and other factors. For most people, a normal blood pressure is less than 120/80.  Hypertension is treated with lifestyle changes, medicines, or a combination of both. Lifestyle changes include losing weight, eating a healthy, low-sodium diet,  exercising more, and limiting alcohol. This information is not intended to replace advice given to you by your health care provider. Make sure you discuss any questions you have with your health care provider. Document Revised: 10/21/2017 Document Reviewed: 10/21/2017 Elsevier Patient Education  [employer].  Leg Cramps Leg cramps occur when one or more muscles tighten and you have no control over this tightening (involuntary muscle contraction). Muscle cramps can develop in any muscle, but the most common place is in the calf muscles of the leg. Those cramps can occur during exercise or when you are at rest. Leg cramps are painful, and they may last for a few seconds to a few minutes. Cramps may return several times before they finally stop. Usually, leg cramps are not caused by a serious medical problem. In many cases, the cause is not known. Some common causes include:  Excessive physical effort (overexertion), such as during intense exercise.  Overuse from repetitive motions, or doing the same thing over and over.  Staying in a certain position for a long period of time.  Improper preparation, form, or technique while performing a sport or an activity.  Dehydration.  Injury.  Side effects of certain medicines.  Abnormally low levels of minerals in your blood (electrolytes), especially potassium and calcium. This could result from: ? Pregnancy. ? Taking diuretic medicines. Follow these instructions at home: Eating and drinking  Drink enough fluid to keep your urine pale yellow. Staying hydrated may help prevent cramps.  Eat a healthy diet that includes plenty of nutrients to help your muscles function. A healthy diet includes fruits and vegetables, lean protein, whole grains, and low-fat or nonfat dairy products. Managing pain, stiffness, and swelling      Try massaging, stretching, and relaxing the affected muscle. Do this for several minutes at a time.  If directed,  put ice on areas that are sore or painful after a cramp: ? Put ice in a plastic bag. ? Place a towel between your skin and the bag. ? Leave the ice on for 20 minutes, 2-3 times a day.  If directed, apply heat to muscles that are tense or tight. Do this before you exercise, or as often as told by your health care provider. Use the heat source that your health care provider recommends, such as a moist heat pack or a heating pad. ? Place a towel between your skin and the heat source. ? Leave the heat on for 20-30 minutes. ? Remove the heat if your skin turns bright red. This is especially important if you are unable to feel pain, heat, or cold. You may have a  greater risk of getting burned.  Try taking hot showers or baths to help relax tight muscles. General instructions  If you are having frequent leg cramps, avoid intense exercise for several days.  Take over-the-counter and prescription medicines only as told by your health care provider.  Keep all follow-up visits as told by your health care provider. This is important. Contact a health care provider if:  Your leg cramps get more severe or more frequent, or they do not improve over time.  Your foot becomes cold, numb, or blue. Summary  Muscle cramps can develop in any muscle, but the most common place is in the calf muscles of the leg.  Leg cramps are painful, and they may last for a few seconds to a few minutes.  Usually, leg cramps are not caused by a serious medical problem. Often, the cause is not known.  Stay hydrated and take over-the-counter and prescription medicines only as told by your health care provider. This information is not intended to replace advice given to you by your health care provider. Make sure you discuss any questions you have with your health care provider. Document Revised: 01/23/2017 Document Reviewed: 11/20/2016 Elsevier Patient Education  2020 [employer].

## 2019-08-23 NOTE — Progress Notes (Signed)
Established patient visit   Patient: Michael [name].   DOB: 1945-12-19   74 y.o. Male  MRN: [id number] Visit Date: 08/23/2019  I,Sulibeya S Dimas,acting as a Education administrator for [employer]have documented all relevant documentation on the behalf of [employer],as directed by  [employer] while in the presence of [employer], [place].  Today's healthcare provider: Vernie Murders, PA   Chief Complaint  Patient presents with   Hyperlipidemia   Hypertension   Anxiety   Subjective    HPI Hypertension, follow-up  BP Readings from Last 3 Encounters:  08/23/19 128/70  07/28/19 133/73  10/18/18 122/62   Wt Readings from Last 3 Encounters:  08/23/19 180 lb 9.6 oz (81.9 kg)  07/28/19 175 lb (79.4 kg)  10/18/18 173 lb 6.4 oz (78.7 kg)     He was last seen for hypertension 1 years ago.  BP at that visit was 150/76. Management since that visit includes NO CHANGES.  He reports excellent compliance with treatment. He is not having side effects.  He is following a Low Sodium diet. He is exercising. He does not smoke.  Use of agents associated with hypertension: none.   Outside blood pressures are stable. Symptoms: No chest pain No chest pressure  No palpitations No syncope  No dyspnea No orthopnea  No paroxysmal nocturnal dyspnea No lower extremity edema   Pertinent labs: Lab Results  Component Value Date   CHOL 167 03/01/2018   HDL 44 03/01/2018   LDLCALC 108 (H) 03/01/2018   TRIG 73 03/01/2018   CHOLHDL 3.8 03/01/2018   Lab Results  Component Value Date   NA 143 09/13/2018   K 4.2 09/13/2018   CREATININE 1.24 09/13/2018   GFRNONAA 59 (L) 09/13/2018   GFRAA 68 09/13/2018   GLUCOSE 103 (H) 09/13/2018     The 10-year ASCVD risk score [name], et al., 2013) is: 21.6%   --------------------------------------------------------------------------------------------------- Lipid/Cholesterol, Follow-up  Last lipid panel Other pertinent  labs  Lab Results  Component Value Date   CHOL 167 03/01/2018   HDL 44 03/01/2018   LDLCALC 108 (H) 03/01/2018   TRIG 73 03/01/2018   CHOLHDL 3.8 03/01/2018   Lab Results  Component Value Date   ALT 18 09/13/2018   AST 23 09/13/2018   PLT 231 09/13/2018   TSH 5.990 (H) 03/01/2018     He was last seen for this 1 years ago.  Management since that visit includes no changes.  He reports excellent compliance with treatment. He is not having side effects.   Symptoms: No chest pain No chest pressure/discomfort  No dyspnea No lower extremity edema  No numbness or tingling of extremity No orthopnea  No palpitations No paroxysmal nocturnal dyspnea  No speech difficulty No syncope   Current diet: in general, a "healthy" diet   Current exercise: gardening and yard work  The 10-year ASCVD risk score [name], et al., 2013) is: 21.6%  --------------------------------------------------------------------------------------------------- Follow up for Anxiety  The patient was last seen for this 1 years ago. Changes made at last visit include no changes continue trazodone 100 mg two tablets daily.  He reports excellent compliance with treatment. He feels that condition is Improved. He is not having side effects.   ----------------------------------------------------------------------------------------- Patient C/O persistent cramps in legs and hand. Patient reports taking magnesium 400 mg daily.    Patient Active Problem List   Diagnosis Date Noted   Erectile dysfunction due to arterial insufficiency 07/16/2017  Low back ache 11/16/2014   Anxiety 08/21/2014   Abnormal finding on thyroid function test 08/21/2014   BPH without obstruction/lower urinary tract symptoms 08/21/2014   ED (erectile dysfunction) of organic origin 08/21/2014   Exposure to viral hepatitis 08/21/2014   Hypercholesteremia 08/21/2014   Benign hypertension 08/21/2014   Severe obstructive sleep  apnea 08/21/2014   Sleep disturbance 08/21/2014   Elevated prostate specific antigen (PSA) 10/21/2011   Past Surgical History:  Procedure Laterality Date   AMPUTATION FINGER Left 1965   5TH DIGIT, INDUSTRIAL ACCIDENT   PROSTATE BIOPSY  03/1998   NEGATIVE   Family History  Problem Relation Age of Onset   Emphysema Mother    Diabetes Maternal Uncle    Allergies  Allergen Reactions   Penicillins    Sulfa Antibiotics    Sulfamethoxazole-Trimethoprim    Medications: Outpatient Medications Prior to Visit  Medication Sig   aspirin 81 MG EC tablet Take by mouth.   atorvastatin (LIPITOR) 40 MG tablet Take 1 tablet (40 mg total) by mouth at bedtime.   Denture Care Products (EFFERDENT DENTURE CLEANSER) TBEF    dutasteride (AVODART) 0.5 MG capsule Take 1 capsule (0.5 mg total) by mouth daily.   losartan-hydrochlorothiazide (HYZAAR) 50-12.5 MG tablet TAKE 1 TABLET EVERY DAY   Magnesium 400 MG TABS Take by mouth.   Omega-3 Fatty Acids (FISH OIL) 1000 MG CAPS Take by mouth.   omeprazole (PRILOSEC) 20 MG capsule Take 1 capsule (20 mg total) by mouth 2 (two) times daily.   sildenafil (REVATIO) 20 MG tablet TAKE 1 TO 5 TABLETS AS NEEDED 1 HOUR PRIOR TO INTERCOURSE   traZODone (DESYREL) 100 MG tablet TAKE 2 TABLETS EVERY DAY   [DISCONTINUED] flurbiprofen (ANSAID) 100 MG tablet Take 1 tablet (100 mg total) by mouth 2 (two) times daily. As needed for back pain.   No facility-administered medications prior to visit.   Review of Systems  Constitutional: Negative.   Respiratory: Negative.   Cardiovascular: Negative.     Objective    BP 128/70 (BP Location: Left Arm, Patient Position: Sitting, Cuff Size: Normal)    Pulse 82    Temp (!) 97.5 F (36.4 C) (Temporal)    Resp 16    Wt 180 lb 9.6 oz (81.9 kg)    SpO2 98%    BMI 29.15 kg/m  BP Readings from Last 3 Encounters:  08/23/19 128/70  07/28/19 133/73  10/18/18 122/62   Wt Readings from Last 3 Encounters:  08/23/19  180 lb 9.6 oz (81.9 kg)  07/28/19 175 lb (79.4 kg)  10/18/18 173 lb 6.4 oz (78.7 kg)   Physical Exam Constitutional:      General: He is not in acute distress.    Appearance: He is well-developed.  HENT:     Head: Normocephalic and atraumatic.     Right Ear: Hearing normal.     Left Ear: Hearing normal.     Nose: Nose normal.  Eyes:     General: Lids are normal. No scleral icterus.       Right eye: No discharge.        Left eye: No discharge.     Conjunctiva/sclera: Conjunctivae normal.  Cardiovascular:     Rate and Rhythm: Normal rate and regular rhythm.     Heart sounds: Normal heart sounds.  Pulmonary:     Effort: Pulmonary effort is normal. No respiratory distress.  Abdominal:     General: Bowel sounds are normal.     Palpations: Abdomen is  soft.  Musculoskeletal:        General: Normal range of motion.     Cervical back: Neck supple.  Skin:    Findings: No lesion or rash.  Neurological:     Mental Status: He is alert and oriented to person, place, and time.  Psychiatric:        Speech: Speech normal.        Behavior: Behavior normal.        Thought Content: Thought content normal.      No results found for any visits on 08/23/19.  Assessment & Plan     1. Benign hypertension Stable BP. Continue Hyzaar 50-12.5 mg qd without side effects. No chest pains, dyspnea or palpitations. No peripheral edema. Recheck follow up labs. - CBC with Differential/Platelet - Comprehensive metabolic panel - TSH  2. Hypercholesteremia Tolerating the Lipitor 40 mg hs without side effects. Continue low fat diet and recheck follow up labs. - CBC with Differential/Platelet - Comprehensive metabolic panel - Lipid Panel With LDL/HDL Ratio - TSH  3. Anxiety Stable control. Still taking Trazodone 100 mg 2 tablets daily without side effects. No suicidal ideation or panic attacks recently. Recheck routine labs. - CBC with Differential/Platelet - TSH  4. Muscle cramps Some night  time cramps in legs over the past few weeks. Slightly better after rehydration. Still taking Magnesium 400 mg qd. Recheck labs. - CBC with Differential/Platelet - Comprehensive metabolic panel - Magnesium   No follow-ups on file.      Andres Shad, PA, have reviewed all documentation for this visit. The documentation on 08/23/19 for the exam, diagnosis, procedures, and orders are all accurate and complete.    Vernie Murders, [place] [phone] (phone) [phone] (fax)  [place]

## 2019-08-24 LAB — CBC WITH DIFFERENTIAL/PLATELET
Basophils Absolute: 0 10*3/uL (ref 0.0–0.2)
Basos: 1 %
EOS (ABSOLUTE): 0.1 10*3/uL (ref 0.0–0.4)
Eos: 2 %
Hematocrit: 48.4 % (ref 37.5–51.0)
Hemoglobin: 16.4 g/dL (ref 13.0–17.7)
Immature Grans (Abs): 0 10*3/uL (ref 0.0–0.1)
Immature Granulocytes: 0 %
Lymphocytes Absolute: 1.9 10*3/uL (ref 0.7–3.1)
Lymphs: 34 %
MCH: 31.4 pg (ref 26.6–33.0)
MCHC: 33.9 g/dL (ref 31.5–35.7)
MCV: 93 fL (ref 79–97)
Monocytes Absolute: 0.5 10*3/uL (ref 0.1–0.9)
Monocytes: 9 %
Neutrophils Absolute: 3.1 10*3/uL (ref 1.4–7.0)
Neutrophils: 54 %
Platelets: 200 10*3/uL (ref 150–450)
RBC: 5.23 x10E6/uL (ref 4.14–5.80)
RDW: 11.9 % (ref 11.6–15.4)
WBC: 5.6 10*3/uL (ref 3.4–10.8)

## 2019-08-24 LAB — COMPREHENSIVE METABOLIC PANEL
ALT: 25 IU/L (ref 0–44)
AST: 22 IU/L (ref 0–40)
Albumin/Globulin Ratio: 2.4 — ABNORMAL HIGH (ref 1.2–2.2)
Albumin: 4.6 g/dL (ref 3.7–4.7)
Alkaline Phosphatase: 68 IU/L (ref 48–121)
BUN/Creatinine Ratio: 15 (ref 10–24)
BUN: 21 mg/dL (ref 8–27)
Bilirubin Total: 0.5 mg/dL (ref 0.0–1.2)
CO2: 24 mmol/L (ref 20–29)
Calcium: 9.6 mg/dL (ref 8.6–10.2)
Chloride: 101 mmol/L (ref 96–106)
Creatinine, Ser: 1.37 mg/dL — ABNORMAL HIGH (ref 0.76–1.27)
GFR calc Af Amer: 60 mL/min/{1.73_m2} (ref >59)
GFR calc non Af Amer: 52 mL/min/{1.73_m2} — ABNORMAL LOW (ref >59)
Globulin, Total: 1.9 g/dL (ref 1.5–4.5)
Glucose: 105 mg/dL — ABNORMAL HIGH (ref 65–99)
Potassium: 4.5 mmol/L (ref 3.5–5.2)
Sodium: 139 mmol/L (ref 134–144)
Total Protein: 6.5 g/dL (ref 6.0–8.5)

## 2019-08-24 LAB — MAGNESIUM: Magnesium: 1.9 mg/dL (ref 1.6–2.3)

## 2019-08-24 LAB — LIPID PANEL WITH LDL/HDL RATIO
Cholesterol, Total: 181 mg/dL (ref 100–199)
HDL: 49 mg/dL (ref >39)
LDL Chol Calc (NIH): 118 mg/dL — ABNORMAL HIGH (ref 0–99)
LDL/HDL Ratio: 2.4 ratio (ref 0.0–3.6)
Triglycerides: 72 mg/dL (ref 0–149)
VLDL Cholesterol Cal: 14 mg/dL (ref 5–40)

## 2019-08-24 LAB — TSH: TSH: 5.51 u[IU]/mL — ABNORMAL HIGH (ref 0.450–4.500)

## 2019-08-25 ENCOUNTER — Telehealth: Payer: Self-pay

## 2019-08-25 DIAGNOSIS — E7800 Pure hypercholesterolemia, unspecified: Secondary | ICD-10-CM

## 2019-08-25 MED ORDER — ATORVASTATIN CALCIUM 80 MG PO TABS: 80.0000 mg | ORAL_TABLET | Freq: Every day | ORAL | 1 refills | Status: DC

## 2019-08-25 NOTE — Telephone Encounter (Signed)
-----   Message from Margo Common, [place] sent at 08/25/2019  8:47 AM EDT ----- Magnesium level normal (not getting too much from supplements). LDL cholesterol still elevated and should increase Atorvastatin to 80 mg qd then recheck levels in 3 months. Kidney function showing a little strain and should drink more water in diet. Limit exposure to the extreme hot weather.

## 2019-08-25 NOTE — Telephone Encounter (Signed)
Patient advised and verbalized understanding. Prescription sent into pharmacy. Follow up appointment scheduled for 12/01/2019 at 8am.

## 2019-09-14 NOTE — Progress Notes (Signed)
Established patient visit   Patient: Michael [name].   DOB: [birth year]   74 y.o. Male  MRN: [id number] Visit Date: 09/15/2019  Today's healthcare provider: Trinna Post, PA-C   Chief Complaint  Patient presents with  . Back Pain  I,Porsha C McClurkin,acting as a scribe for Trinna Post, PA-C.,have documented all relevant documentation on the behalf of Trinna Post, PA-C,as directed by  Trinna Post, PA-C while in the presence of Trinna Post, PA-C.   Subjective    Back Pain This is a new problem. The current episode started 1 to 4 weeks ago. The problem occurs intermittently. The problem is unchanged. The pain is present in the lumbar spine. The quality of the pain is described as shooting. The pain radiates to the left thigh (left leg). The pain is at a severity of 5/10. The pain is moderate. The pain is the same all the time. The symptoms are aggravated by lying down, standing and bending. Associated symptoms include leg pain. Pertinent negatives include no abdominal pain, chest pain, numbness, pelvic pain, tingling or weakness. He has tried NSAIDs for the symptoms. The treatment provided significant relief.  Patient reports that he was running from a black snake and hit his head on the stable door and felled on his back. Patient states that he has taking anti inflammatory to help treat the pain that shoots down his left hip and leg. He has been having sciatica pain down his left leg, he has had this before.    Patient reports after starting the increased dose of Lipitor to 80 mg daily he did have diarrhea, but it has stopped. Patient reports that he has changed up his diet and working in weight loss. He reports stop eating candy bars, ice cream,little debbie cakes, 2 mc doubles, changed to wheat crackers, egg whites in olive oil, drink 1% milk and wheat bran cereal. Patient weight at his visit on 08/23/2019 was 180 lbs and now his weight is 173.6 lbs.       Medications: Outpatient Medications Prior to Visit  Medication Sig  . aspirin 81 MG EC tablet Take by mouth.  [name] Kitchen atorvastatin (LIPITOR) 80 MG tablet Take 1 tablet (80 mg total) by mouth daily.  . Denture Care Products (EFFERDENT DENTURE CLEANSER) TBEF   . dutasteride (AVODART) 0.5 MG capsule Take 1 capsule (0.5 mg total) by mouth daily.  [name] Kitchen losartan-hydrochlorothiazide (HYZAAR) 50-12.5 MG tablet TAKE 1 TABLET EVERY DAY  . Magnesium 400 MG TABS Take by mouth.  . Omega-3 Fatty Acids (FISH OIL) 1000 MG CAPS Take by mouth.  [name] Kitchen omeprazole (PRILOSEC) 20 MG capsule Take 1 capsule (20 mg total) by mouth 2 (two) times daily.  . sildenafil (REVATIO) 20 MG tablet TAKE 1 TO 5 TABLETS AS NEEDED 1 HOUR PRIOR TO INTERCOURSE  . traZODone (DESYREL) 100 MG tablet TAKE 2 TABLETS EVERY DAY   No facility-administered medications prior to visit.    Review of Systems  Cardiovascular: Negative for chest pain.  Gastrointestinal: Negative for abdominal pain.  Genitourinary: Negative for pelvic pain.  Musculoskeletal: Positive for back pain.  Neurological: Negative for tingling, weakness and numbness.      Objective    BP (!) 134/82 (BP Location: Left Arm, Patient Position: Sitting, Cuff Size: Normal)   Pulse 81   Temp (!) 96.9 F (36.1 C) (Temporal)   Wt 173 lb 9.6 oz (78.7 kg)   SpO2 98%   BMI 28.02 kg/m  Physical Exam Constitutional:      Appearance: Normal appearance.  Cardiovascular:     Rate and Rhythm: Normal rate and regular rhythm.     Pulses: Normal pulses.     Heart sounds: Normal heart sounds.  Pulmonary:     Effort: Pulmonary effort is normal.     Breath sounds: Normal breath sounds.  Abdominal:     General: Abdomen is flat.     Palpations: Abdomen is soft.  Skin:    General: Skin is warm and dry.  Neurological:     General: No focal deficit present.     Mental Status: He is alert and oriented to person, place, and time.  Psychiatric:        Mood and Affect: Mood normal.         Behavior: Behavior normal.       No results found for any visits on 09/15/19.  Assessment & Plan    1. Back pain, unspecified back location, unspecified back pain laterality, unspecified chronicity  Consistent with lumbar radiculopathy. Counseled on stepwise approach to tx including muscle relaxers, anti-inflammatories, PT, gabapentin, lyrica, ESI and ultimately surgery. Return precautions counseled.  - predniSONE (DELTASONE) 10 MG tablet; Take taper dose: take 6 on day 1, 5 on day 2, 4 on day 3, etc...  Dispense: 21 tablet; Refill: 0  2. Hypercholesteremia  Counseled the medication only works while taking it. Would be unknown if his diet was enough to change his cholesterol unless he did another trial at lipitor 40mg . Patient agreeable to staying with lipitor 80 mg for now and rechecking in 3 months with PCP.     Return if symptoms worsen or fail to improve.      ITrinna Post, PA-C, have reviewed all documentation for this visit. The documentation on 09/16/19 for the exam, diagnosis, procedures, and orders are all accurate and complete.  I have spent 25 minutes with this patient, >50% of which was spent on counseling and coordination of care.    Paulene Floor  [facility] [phone] (phone) [phone] (fax)  [place]

## 2019-09-15 ENCOUNTER — Encounter: Payer: Self-pay | Admitting: Physician Assistant

## 2019-09-15 ENCOUNTER — Other Ambulatory Visit: Payer: Self-pay

## 2019-09-15 ENCOUNTER — Ambulatory Visit (INDEPENDENT_AMBULATORY_CARE_PROVIDER_SITE_OTHER): Payer: Medicare HMO | Admitting: Physician Assistant

## 2019-09-15 VITALS — BP 134/82 | HR 81 | Temp 96.9°F | Wt 173.6 lb

## 2019-09-15 DIAGNOSIS — M549 Dorsalgia, unspecified: Secondary | ICD-10-CM

## 2019-09-15 DIAGNOSIS — E7800 Pure hypercholesterolemia, unspecified: Secondary | ICD-10-CM

## 2019-09-15 MED ORDER — PREDNISONE 10 MG PO TABS: ORAL_TABLET | ORAL | 0 refills | Status: DC

## 2019-09-15 NOTE — Patient Instructions (Signed)
Acute Back Pain, Adult Acute back pain is sudden and usually short-lived. It is often caused by an injury to the muscles and tissues in the back. The injury may result from:  A muscle or ligament getting overstretched or torn (strained). Ligaments are tissues that connect bones to each other. Lifting something improperly can cause a back strain.  Wear and tear (degeneration) of the spinal disks. Spinal disks are circular tissue that provides cushioning between the bones of the spine (vertebrae).  Twisting motions, such as while playing sports or doing yard work.  A hit to the back.  Arthritis. You may have a physical exam, lab tests, and imaging tests to find the cause of your pain. Acute back pain usually goes away with rest and home care. Follow these instructions at home: Managing pain, stiffness, and swelling  Take over-the-counter and prescription medicines only as told by your health care provider.  Your health care provider may recommend applying ice during the first 24-48 hours after your pain starts. To do this: ? Put ice in a plastic bag. ? Place a towel between your skin and the bag. ? Leave the ice on for 20 minutes, 2-3 times a day.  If directed, apply heat to the affected area as often as told by your health care provider. Use the heat source that your health care provider recommends, such as a moist heat pack or a heating pad. ? Place a towel between your skin and the heat source. ? Leave the heat on for 20-30 minutes. ? Remove the heat if your skin turns bright red. This is especially important if you are unable to feel pain, heat, or cold. You have a greater risk of getting burned. Activity   Do not stay in bed. Staying in bed for more than 1-2 days can delay your recovery.  Sit up and stand up straight. Avoid leaning forward when you sit, or hunching over when you stand. ? If you work at a desk, sit close to it so you do not need to lean over. Keep your chin tucked  in. Keep your neck drawn back, and keep your elbows bent at a right angle. Your arms should look like the letter "L." ? Sit high and close to the steering wheel when you drive. Add lower back (lumbar) support to your car seat, if needed.  Take short walks on even surfaces as soon as you are able. Try to increase the length of time you walk each day.  Do not sit, drive, or stand in one place for more than 30 minutes at a time. Sitting or standing for long periods of time can put stress on your back.  Do not drive or use heavy machinery while taking prescription pain medicine.  Use proper lifting techniques. When you bend and lift, use positions that put less stress on your back: ? Bend your knees. ? Keep the load close to your body. ? Avoid twisting.  Exercise regularly as told by your health care provider. Exercising helps your back heal faster and helps prevent back injuries by keeping muscles strong and flexible.  Work with a physical therapist to make a safe exercise program, as recommended by your health care provider. Do any exercises as told by your physical therapist. Lifestyle  Maintain a healthy weight. Extra weight puts stress on your back and makes it difficult to have good posture.  Avoid activities or situations that make you feel anxious or stressed. Stress and anxiety increase muscle   tension and can make back pain worse. Learn ways to manage anxiety and stress, such as through exercise. General instructions  Sleep on a firm mattress in a comfortable position. Try lying on your side with your knees slightly bent. If you lie on your back, put a pillow under your knees.  Follow your treatment plan as told by your health care provider. This may include: ? Cognitive or behavioral therapy. ? Acupuncture or massage therapy. ? Meditation or yoga. Contact a health care provider if:  You have pain that is not relieved with rest or medicine.  You have increasing pain going down  into your legs or buttocks.  Your pain does not improve after 2 weeks.  You have pain at night.  You lose weight without trying.  You have a fever or chills. Get help right away if:  You develop new bowel or bladder control problems.  You have unusual weakness or numbness in your arms or legs.  You develop nausea or vomiting.  You develop abdominal pain.  You feel faint. Summary  Acute back pain is sudden and usually short-lived.  Use proper lifting techniques. When you bend and lift, use positions that put less stress on your back.  Take over-the-counter and prescription medicines and apply heat or ice as directed by your health care provider. This information is not intended to replace advice given to you by your health care provider. Make sure you discuss any questions you have with your health care provider. Document Revised: 06/01/2018 Document Reviewed: 09/24/2016 Elsevier Patient Education  2020 [employer]  

## 2019-09-16 ENCOUNTER — Other Ambulatory Visit: Payer: Self-pay | Admitting: Family Medicine

## 2019-09-16 DIAGNOSIS — F419 Anxiety disorder, unspecified: Secondary | ICD-10-CM

## 2019-09-21 ENCOUNTER — Ambulatory Visit: Payer: Self-pay | Admitting: *Deleted

## 2019-09-21 NOTE — Telephone Encounter (Signed)
Summary: Clinical Advice    Patient last seen 09/15/2019 for right leg calf pain. Patient scheduled follow up on 09/23/2019, symptoms have not improved seeking clinical advice prior to the appt      Patient has had recent increase in dosage of cholesterol medication. Patient is doing well with diet and weight loss. Patient thinks it is causing severe cramps in legs. Patient is unable to sleep at night. Patient wants to know if he needs to divide dose or go back down to lower dose. Patient is willing to try anything because he wants his lipids to be lower.  Reason for Disposition  [1] Caller has URGENT medicine question about med that PCP or specialist prescribed AND [2] triager unable to answer question  Answer Assessment - Initial Assessment Questions 1. ONSET: "When did the pain start?"      Cramping started- last 2 afternoons have been bad. History of leg cramps- but since changing dosage of cholesterol medication 2. LOCATION: "Where is the pain located?"      Both legs- R is worse- inside of thighs cramp too 3. PAIN: "How bad is the pain?"    (Scale 1-10; or mild, moderate, severe)   -  MILD (1-3): doesn't interfere with normal activities    -  MODERATE (4-7): interferes with normal activities (e.g., work or school) or awakens from sleep, limping    -  SEVERE (8-10): excruciating pain, unable to do any normal activities, unable to walk     Sore today- severe last night 4. WORK OR EXERCISE: "Has there been any recent work or exercise that involved this part of the body?"      Patient works outside and is busy- but not that he knows 5. CAUSE: "What do you think is causing the leg pain?"     Cholesterol medication 6. OTHER SYMPTOMS: "Do you have any other symptoms?" (e.g., chest pain, back pain, breathing difficulty, swelling, rash, fever, numbness, weakness)     no 7. PREGNANCY: "Is there any chance you are pregnant?" "When was your last menstrual period?"     n/a  Answer Assessment -  Initial Assessment Questions 1. NAME of MEDICATION: "What medicine are you calling about?"     Lipitor side effects 2. QUESTION: "What is your question?" (e.g., medication refill, side effect)     Patient has noticed increased leg cramping since he increased his dosage- he states the last couple nights have been severe 3. PRESCRIBING HCP: "Who prescribed it?" Reason: if prescribed by specialist, call should be referred to that group.     PCP 4. SYMPTOMS: "Do you have any symptoms?"     Leg cramps- bilateral- R worse and inner thigh cramps 5. SEVERITY: If symptoms are present, ask "Are they mild, moderate or severe?"     Severe last night- sore today- patient states he can feel the muscles when he walks 6. PREGNANCY:  "Is there any chance that you are pregnant?" "When was your last menstrual period?"     n/a  Protocols used: MEDICATION QUESTION CALL-A-AH, LEG PAIN-A-AH

## 2019-09-22 NOTE — Progress Notes (Signed)
Trena Platt Cummings,acting as a Education administrator for Trinna Post, PA-C.,have documented all relevant documentation on the behalf of Trinna Post, PA-C,as directed by  Trinna Post, PA-C while in the presence of Trinna Post, PA-C.  Established patient visit   Patient: Michael [name].   DOB: 1945/05/18   74 y.o. Male  MRN: [id number] Visit Date: 09/23/2019  Today's healthcare provider: Trinna Post, PA-C   Chief Complaint  Patient presents with  . right leg pain   Subjective    Leg Pain  The incident occurred 5 to 7 days ago. There was no injury mechanism. The pain is present in the left leg, right leg, right thigh and left thigh. The quality of the pain is described as stabbing. The pain has been constant since onset. Associated symptoms include an inability to bear weight. Pertinent negatives include no numbness or tingling. The symptoms are aggravated by movement and weight bearing. He has tried heat for the symptoms.    Patient was started on 80 mg atorvastatin for cholesterol. His leg was hurting so bad that he could not walk on his right foot or flex foot. Patient decreased his dose back to 40 mg. Patient said his pain went away after 2 days.   Patient said that one the first day of the prednisone it helped his leg pain go away and is experiencing much relief from sciatica.   Patient says that he would like to get his cholesterol under control.      Medications: Outpatient Medications Prior to Visit  Medication Sig  . aspirin 81 MG EC tablet Take by mouth.  [name] Kitchen atorvastatin (LIPITOR) 80 MG tablet Take 1 tablet (80 mg total) by mouth daily.  . Denture Care Products (EFFERDENT DENTURE CLEANSER) TBEF   . dutasteride (AVODART) 0.5 MG capsule Take 1 capsule (0.5 mg total) by mouth daily.  [name] Kitchen losartan-hydrochlorothiazide (HYZAAR) 50-12.5 MG tablet TAKE 1 TABLET EVERY DAY  . Magnesium 400 MG TABS Take by mouth.  . Omega-3 Fatty Acids (FISH OIL) 1000 MG CAPS Take by  mouth.  [name] Kitchen omeprazole (PRILOSEC) 20 MG capsule Take 1 capsule (20 mg total) by mouth 2 (two) times daily.  . predniSONE (DELTASONE) 10 MG tablet Take taper dose: take 6 on day 1, 5 on day 2, 4 on day 3, etc...  . sildenafil (REVATIO) 20 MG tablet TAKE 1 TO 5 TABLETS AS NEEDED 1 HOUR PRIOR TO INTERCOURSE  . traZODone (DESYREL) 100 MG tablet TAKE 2 TABLETS EVERY DAY   No facility-administered medications prior to visit.    Review of Systems  Neurological: Negative for tingling and numbness.      Objective    BP (!) 139/73 (BP Location: Right Arm, Patient Position: Sitting, Cuff Size: Normal)   Pulse 65   Temp 98.1 F (36.7 C) (Temporal)   Wt 168 lb 12.8 oz (76.6 kg)   BMI 27.25 kg/m    Physical Exam Vitals reviewed.  Constitutional:      Appearance: Normal appearance.  HENT:     Head: Normocephalic and atraumatic.     Right Ear: External ear normal.     Left Ear: External ear normal.  Eyes:     General: No scleral icterus.    Conjunctiva/sclera: Conjunctivae normal.  Cardiovascular:     Rate and Rhythm: Normal rate and regular rhythm.     Pulses: Normal pulses.     Heart sounds: Normal heart sounds.  Pulmonary:  Effort: Pulmonary effort is normal.     Breath sounds: Normal breath sounds.  Musculoskeletal:     Right lower leg: No edema.     Left lower leg: No edema.  Skin:    General: Skin is warm and dry.  Neurological:     General: No focal deficit present.     Mental Status: He is alert and oriented to person, place, and time.  Psychiatric:        Mood and Affect: Mood normal.        Behavior: Behavior normal.        Thought Content: Thought content normal.        Judgment: Judgment normal.       No results found for any visits on 09/23/19.  Assessment & Plan     Problem List Items Addressed This Visit      Other   Hypercholesteremia - Primary    Uncontrolled Experiencing leg cramps on atorvastatin  Stop Atorvastatin  Start Crestor 10 MG    Follow up in Oct with PCP Recheck lipids at next visit        Relevant Medications   rosuvastatin (CRESTOR) 10 MG tablet       Return in about 3 months (around 12/24/2019) for follow up with pcp.      ITrinna Post, PA-C, have reviewed all documentation for this visit. The documentation on 09/23/19 for the exam, diagnosis, procedures, and orders are all accurate and complete.    Paulene Floor  [facility] [phone] (phone) [phone] (fax)  [place]

## 2019-09-22 NOTE — Telephone Encounter (Signed)
Stop statin for now and follow-up with Simona Huh in a few weeks.

## 2019-09-22 NOTE — Telephone Encounter (Signed)
Called to advise patient's spouse she said that he was out mowing but last night be cut back his cholesterol medication to 40mg  and it seemed to be helping. Patient also has an appointment tomorrow morning with Terrilee Croak.

## 2019-09-23 ENCOUNTER — Ambulatory Visit (INDEPENDENT_AMBULATORY_CARE_PROVIDER_SITE_OTHER): Payer: Medicare HMO | Admitting: Physician Assistant

## 2019-09-23 ENCOUNTER — Encounter: Payer: Self-pay | Admitting: Physician Assistant

## 2019-09-23 ENCOUNTER — Other Ambulatory Visit: Payer: Self-pay

## 2019-09-23 VITALS — BP 139/73 | HR 65 | Temp 98.1°F | Wt 168.8 lb

## 2019-09-23 DIAGNOSIS — E7800 Pure hypercholesterolemia, unspecified: Secondary | ICD-10-CM | POA: Diagnosis not present

## 2019-09-23 MED ORDER — ROSUVASTATIN CALCIUM 10 MG PO TABS: 10.0000 mg | ORAL_TABLET | Freq: Every day | ORAL | 3 refills | Status: DC

## 2019-09-23 NOTE — Assessment & Plan Note (Signed)
Uncontrolled Experiencing leg cramps on atorvastatin  Stop Atorvastatin  Start Crestor 10 MG  Follow up in Oct with PCP Recheck lipids at next visit

## 2019-10-19 DIAGNOSIS — D2261 Melanocytic nevi of right upper limb, including shoulder: Secondary | ICD-10-CM | POA: Diagnosis not present

## 2019-10-19 DIAGNOSIS — D2271 Melanocytic nevi of right lower limb, including hip: Secondary | ICD-10-CM | POA: Diagnosis not present

## 2019-10-19 DIAGNOSIS — D0462 Carcinoma in situ of skin of left upper limb, including shoulder: Secondary | ICD-10-CM | POA: Diagnosis not present

## 2019-10-19 DIAGNOSIS — L570 Actinic keratosis: Secondary | ICD-10-CM | POA: Diagnosis not present

## 2019-10-19 DIAGNOSIS — L578 Other skin changes due to chronic exposure to nonionizing radiation: Secondary | ICD-10-CM | POA: Diagnosis not present

## 2019-10-19 DIAGNOSIS — X32XXXA Exposure to sunlight, initial encounter: Secondary | ICD-10-CM | POA: Diagnosis not present

## 2019-10-19 DIAGNOSIS — Z85828 Personal history of other malignant neoplasm of skin: Secondary | ICD-10-CM | POA: Diagnosis not present

## 2019-10-19 DIAGNOSIS — D225 Melanocytic nevi of trunk: Secondary | ICD-10-CM | POA: Diagnosis not present

## 2019-10-19 DIAGNOSIS — D2262 Melanocytic nevi of left upper limb, including shoulder: Secondary | ICD-10-CM | POA: Diagnosis not present

## 2019-10-19 DIAGNOSIS — D485 Neoplasm of uncertain behavior of skin: Secondary | ICD-10-CM | POA: Diagnosis not present

## 2019-11-16 DIAGNOSIS — D0462 Carcinoma in situ of skin of left upper limb, including shoulder: Secondary | ICD-10-CM | POA: Diagnosis not present

## 2019-11-16 DIAGNOSIS — L570 Actinic keratosis: Secondary | ICD-10-CM | POA: Diagnosis not present

## 2019-11-28 ENCOUNTER — Other Ambulatory Visit: Payer: Self-pay | Admitting: Urology

## 2019-11-28 DIAGNOSIS — N5201 Erectile dysfunction due to arterial insufficiency: Secondary | ICD-10-CM

## 2019-12-01 ENCOUNTER — Ambulatory Visit (INDEPENDENT_AMBULATORY_CARE_PROVIDER_SITE_OTHER): Payer: Medicare HMO | Admitting: Family Medicine

## 2019-12-01 ENCOUNTER — Other Ambulatory Visit: Payer: Self-pay

## 2019-12-01 ENCOUNTER — Other Ambulatory Visit: Payer: Self-pay | Admitting: Family Medicine

## 2019-12-01 ENCOUNTER — Encounter: Payer: Self-pay | Admitting: Family Medicine

## 2019-12-01 VITALS — BP 125/68 | HR 65 | Temp 97.8°F | Resp 16 | Wt 164.0 lb

## 2019-12-01 DIAGNOSIS — E7800 Pure hypercholesterolemia, unspecified: Secondary | ICD-10-CM

## 2019-12-01 DIAGNOSIS — K219 Gastro-esophageal reflux disease without esophagitis: Secondary | ICD-10-CM | POA: Diagnosis not present

## 2019-12-01 DIAGNOSIS — I10 Essential (primary) hypertension: Secondary | ICD-10-CM | POA: Diagnosis not present

## 2019-12-01 MED ORDER — OMEPRAZOLE 20 MG PO CPDR: 20.0000 mg | DELAYED_RELEASE_CAPSULE | Freq: Every day | ORAL | Status: DC

## 2019-12-01 MED ORDER — DUTASTERIDE 0.5 MG PO CAPS: ORAL_CAPSULE | ORAL | 1 refills | Status: DC

## 2019-12-01 NOTE — Progress Notes (Signed)
Established patient visit   Patient: Michael [name].   DOB: 04-25-1945   74 y.o. Male  MRN: [id number] Visit Date: 12/01/2019  Today's healthcare provider: Vernie Murders, PA   Chief Complaint  Patient presents with  . Hyperlipidemia   Subjective    HPI  Lipid/Cholesterol, Follow-up  Last lipid panel Other pertinent labs  Lab Results  Component Value Date   CHOL 181 08/23/2019   HDL 49 08/23/2019   LDLCALC 118 (H) 08/23/2019   TRIG 72 08/23/2019   CHOLHDL 3.8 03/01/2018   Lab Results  Component Value Date   ALT 25 08/23/2019   AST 22 08/23/2019   PLT 200 08/23/2019   TSH 5.510 (H) 08/23/2019     He was last seen for this 09/23/2019 (seen by Carles Collet, PA-C). Management since that visit includes stopping Atorvastatin due to leg cramps. Patient was changed to Crestor 10mg  daily.  He reports good compliance with treatment. He is not having side effects.   Symptoms: No chest pain No chest pressure/discomfort  No dyspnea No lower extremity edema  No numbness or tingling of extremity No orthopnea  No palpitations No paroxysmal nocturnal dyspnea  No speech difficulty No syncope   Current diet: well balanced Current exercise: yard work  The 10-year ASCVD risk score [name], et al., 2013) is: 20.7%  ---------------------------------------------------------------------------------------------------  No past medical history on file. Past Surgical History:  Procedure Laterality Date  . AMPUTATION FINGER Left 1965   5TH DIGIT, INDUSTRIAL ACCIDENT  . PROSTATE BIOPSY  03/1998   NEGATIVE   Social History   Tobacco Use  . Smoking status: Never Smoker  . Smokeless tobacco: Never Used  Vaping Use  . Vaping Use: Never used  Substance Use Topics  . Alcohol use: No    Alcohol/week: 0.0 standard drinks  . Drug use: No   Family Status  Relation Name Status  . Mother  Deceased  . Father  Deceased  . Mat Uncle  Alive   Allergies  Allergen  Reactions  . Penicillins   . Sulfa Antibiotics   . Sulfamethoxazole-Trimethoprim        Medications: Outpatient Medications Prior to Visit  Medication Sig  . aspirin 81 MG EC tablet Take by mouth.  . Coenzyme Q10 (CO Q-10) 100 MG CAPS Take 1 capsule by mouth daily.  . Denture Care Products (EFFERDENT DENTURE CLEANSER) TBEF   . dutasteride (AVODART) 0.5 MG capsule Take 1 capsule (0.5 mg total) by mouth daily. (Patient taking differently: Take 0.5 mg by mouth. Takes on Monday, Wednesday and Friday)  . losartan-hydrochlorothiazide (HYZAAR) 50-12.5 MG tablet TAKE 1 TABLET EVERY DAY  . Magnesium 400 MG TABS Take by mouth.  . Omega-3 Fatty Acids (FISH OIL) 1000 MG CAPS Take 2,000 mg by mouth daily.   [name] Kitchen omeprazole (PRILOSEC) 20 MG capsule Take 1 capsule (20 mg total) by mouth 2 (two) times daily. (Patient taking differently: Take 20 mg by mouth daily. )  . rosuvastatin (CRESTOR) 10 MG tablet Take 1 tablet (10 mg total) by mouth daily.  . sildenafil (REVATIO) 20 MG tablet TAKE 1 TO 5 TABLETS AS NEEDED 1 HOUR PRIOR TO INTERCOURSE  . traZODone (DESYREL) 100 MG tablet TAKE 2 TABLETS EVERY DAY  . [DISCONTINUED] atorvastatin (LIPITOR) 80 MG tablet Take 1 tablet (80 mg total) by mouth daily. (Patient not taking: Reported on 12/01/2019)  . [DISCONTINUED] predniSONE (DELTASONE) 10 MG tablet Take taper dose: take 6 on day 1, 5 on  day 2, 4 on day 3, etc... (Patient not taking: Reported on 12/01/2019)   No facility-administered medications prior to visit.    Review of Systems  Constitutional: Negative for appetite change, chills and fever.  Respiratory: Negative for chest tightness, shortness of breath and wheezing.   Cardiovascular: Negative for chest pain and palpitations.  Gastrointestinal: Negative for abdominal pain, nausea and vomiting.    Last CBC Lab Results  Component Value Date   WBC 5.6 08/23/2019   HGB 16.4 08/23/2019   HCT 48.4 08/23/2019   MCV 93 08/23/2019   MCH 31.4 08/23/2019    RDW 11.9 08/23/2019   PLT 200 74/25/9563   Last metabolic panel Lab Results  Component Value Date   GLUCOSE 105 (H) 08/23/2019   NA 139 08/23/2019   K 4.5 08/23/2019   CL 101 08/23/2019   CO2 24 08/23/2019   BUN 21 08/23/2019   CREATININE 1.37 (H) 08/23/2019   GFRNONAA 52 (L) 08/23/2019   GFRAA 60 08/23/2019   CALCIUM 9.6 08/23/2019   PROT 6.5 08/23/2019   ALBUMIN 4.6 08/23/2019   LABGLOB 1.9 08/23/2019   AGRATIO 2.4 (H) 08/23/2019   BILITOT 0.5 08/23/2019   ALKPHOS 68 08/23/2019   AST 22 08/23/2019   ALT 25 08/23/2019   Last lipids Lab Results  Component Value Date   CHOL 181 08/23/2019   HDL 49 08/23/2019   LDLCALC 118 (H) 08/23/2019   TRIG 72 08/23/2019   CHOLHDL 3.8 03/01/2018      Objective    BP 125/68 (BP Location: Right Arm, Patient Position: Sitting, Cuff Size: Large)   Pulse 65   Temp 97.8 F (36.6 C) (Oral)   Resp 16   Wt 164 lb (74.4 kg)   SpO2 100% Comment: room air  BMI 26.47 kg/m  BP Readings from Last 3 Encounters:  12/01/19 125/68  09/23/19 (!) 139/73  09/15/19 (!) 134/82   Wt Readings from Last 3 Encounters:  12/01/19 164 lb (74.4 kg)  09/23/19 168 lb 12.8 oz (76.6 kg)  09/15/19 173 lb 9.6 oz (78.7 kg)   Physical Exam Constitutional:      General: He is not in acute distress.    Appearance: He is well-developed.  HENT:     Head: Normocephalic and atraumatic.     Right Ear: Hearing and tympanic membrane normal.     Left Ear: Hearing and tympanic membrane normal.     Nose: Nose normal.     Mouth/Throat:     Pharynx: Oropharynx is clear.  Eyes:     General: Lids are normal. No scleral icterus.       Right eye: No discharge.        Left eye: No discharge.     Conjunctiva/sclera: Conjunctivae normal.  Cardiovascular:     Rate and Rhythm: Normal rate and regular rhythm.  Pulmonary:     Effort: Pulmonary effort is normal. No respiratory distress.     Breath sounds: Normal breath sounds.  Abdominal:     General: Bowel sounds are  normal.     Palpations: Abdomen is soft.  Musculoskeletal:        General: Normal range of motion.     Cervical back: Neck supple.  Skin:    Findings: No lesion or rash.  Neurological:     Mental Status: He is alert and oriented to person, place, and time.  Psychiatric:        Speech: Speech normal.        Behavior:  Behavior normal.        Thought Content: Thought content normal.       No results found for any visits on 12/01/19.  Assessment & Plan     1. Hypercholesteremia Tolerating the Rosuvastatin with Co-Q 10 without side. Recheck follow up labs. - Comprehensive metabolic panel - Lipid panel  2. Benign hypertension Well controlled BP. Tolerating the Hyzaar 50-12.5 mg qd without side effects. Recheck labs. - CBC with Differential/Platelet - Comprehensive metabolic panel - Lipid panel  3. Gastroesophageal reflux disease No hematemesis or hematochezia. Dyspepsia well controlled with Prilosec 20 mg qd. - omeprazole (PRILOSEC) 20 MG capsule; Take 1 capsule (20 mg total) by mouth daily.   No follow-ups on file.      Andres Shad, PA, have reviewed all documentation for this visit. The documentation on 12/01/19 for the exam, diagnosis, procedures, and orders are all accurate and complete.    Vernie Murders, [place] [phone] (phone) [phone] (fax)  [place]

## 2019-12-02 LAB — COMPREHENSIVE METABOLIC PANEL
ALT: 17 IU/L (ref 0–44)
AST: 17 IU/L (ref 0–40)
Albumin/Globulin Ratio: 2.3 — ABNORMAL HIGH (ref 1.2–2.2)
Albumin: 4.5 g/dL (ref 3.7–4.7)
Alkaline Phosphatase: 64 IU/L (ref 44–121)
BUN/Creatinine Ratio: 19 (ref 10–24)
BUN: 21 mg/dL (ref 8–27)
Bilirubin Total: 0.6 mg/dL (ref 0.0–1.2)
CO2: 28 mmol/L (ref 20–29)
Calcium: 9.8 mg/dL (ref 8.6–10.2)
Chloride: 99 mmol/L (ref 96–106)
Creatinine, Ser: 1.09 mg/dL (ref 0.76–1.27)
GFR calc Af Amer: 79 mL/min/{1.73_m2} (ref >59)
GFR calc non Af Amer: 68 mL/min/{1.73_m2} (ref >59)
Globulin, Total: 2 g/dL (ref 1.5–4.5)
Glucose: 93 mg/dL (ref 65–99)
Potassium: 4 mmol/L (ref 3.5–5.2)
Sodium: 139 mmol/L (ref 134–144)
Total Protein: 6.5 g/dL (ref 6.0–8.5)

## 2019-12-02 LAB — CBC WITH DIFFERENTIAL/PLATELET
Basophils Absolute: 0 10*3/uL (ref 0.0–0.2)
Basos: 1 %
EOS (ABSOLUTE): 0.2 10*3/uL (ref 0.0–0.4)
Eos: 4 %
Hematocrit: 48 % (ref 37.5–51.0)
Hemoglobin: 16.1 g/dL (ref 13.0–17.7)
Immature Grans (Abs): 0 10*3/uL (ref 0.0–0.1)
Immature Granulocytes: 0 %
Lymphocytes Absolute: 1.8 10*3/uL (ref 0.7–3.1)
Lymphs: 35 %
MCH: 31.5 pg (ref 26.6–33.0)
MCHC: 33.5 g/dL (ref 31.5–35.7)
MCV: 94 fL (ref 79–97)
Monocytes Absolute: 0.5 10*3/uL (ref 0.1–0.9)
Monocytes: 10 %
Neutrophils Absolute: 2.6 10*3/uL (ref 1.4–7.0)
Neutrophils: 50 %
Platelets: 198 10*3/uL (ref 150–450)
RBC: 5.11 x10E6/uL (ref 4.14–5.80)
RDW: 12.1 % (ref 11.6–15.4)
WBC: 5.1 10*3/uL (ref 3.4–10.8)

## 2019-12-02 LAB — LIPID PANEL
Chol/HDL Ratio: 4.5 ratio (ref 0.0–5.0)
Cholesterol, Total: 161 mg/dL (ref 100–199)
HDL: 36 mg/dL — ABNORMAL LOW (ref >39)
LDL Chol Calc (NIH): 110 mg/dL — ABNORMAL HIGH (ref 0–99)
Triglycerides: 77 mg/dL (ref 0–149)
VLDL Cholesterol Cal: 15 mg/dL (ref 5–40)

## 2020-02-23 ENCOUNTER — Other Ambulatory Visit: Payer: Self-pay | Admitting: Family Medicine

## 2020-02-23 DIAGNOSIS — F419 Anxiety disorder, unspecified: Secondary | ICD-10-CM

## 2020-03-15 DIAGNOSIS — D2262 Melanocytic nevi of left upper limb, including shoulder: Secondary | ICD-10-CM | POA: Diagnosis not present

## 2020-03-15 DIAGNOSIS — L570 Actinic keratosis: Secondary | ICD-10-CM | POA: Diagnosis not present

## 2020-03-15 DIAGNOSIS — X32XXXA Exposure to sunlight, initial encounter: Secondary | ICD-10-CM | POA: Diagnosis not present

## 2020-03-15 DIAGNOSIS — D225 Melanocytic nevi of trunk: Secondary | ICD-10-CM | POA: Diagnosis not present

## 2020-03-15 DIAGNOSIS — D2271 Melanocytic nevi of right lower limb, including hip: Secondary | ICD-10-CM | POA: Diagnosis not present

## 2020-03-15 DIAGNOSIS — L538 Other specified erythematous conditions: Secondary | ICD-10-CM | POA: Diagnosis not present

## 2020-03-15 DIAGNOSIS — Z85828 Personal history of other malignant neoplasm of skin: Secondary | ICD-10-CM | POA: Diagnosis not present

## 2020-03-15 DIAGNOSIS — L820 Inflamed seborrheic keratosis: Secondary | ICD-10-CM | POA: Diagnosis not present

## 2020-03-15 DIAGNOSIS — D2261 Melanocytic nevi of right upper limb, including shoulder: Secondary | ICD-10-CM | POA: Diagnosis not present

## 2020-03-22 ENCOUNTER — Other Ambulatory Visit: Payer: Self-pay | Admitting: Urology

## 2020-03-22 DIAGNOSIS — N5201 Erectile dysfunction due to arterial insufficiency: Secondary | ICD-10-CM

## 2020-03-24 ENCOUNTER — Other Ambulatory Visit: Payer: Self-pay | Admitting: Family Medicine

## 2020-03-24 DIAGNOSIS — I10 Essential (primary) hypertension: Secondary | ICD-10-CM

## 2020-04-04 NOTE — Progress Notes (Signed)
Established patient visit   Patient: Michael [name].   DOB: 1946-02-14   75 y.o. Male  MRN: [id number] Visit Date: 04/05/2020  Today's healthcare provider: Trinna Post, PA-C   Chief Complaint  Patient presents with  . Hypertension  I,Berel Najjar M Hannalee Castor,acting as a scribe for [employer], PA-C.,have documented all relevant documentation on the behalf of Trinna Post, PA-C,as directed by  Trinna Post, PA-C while in the presence of Trinna Post, PA-C.  Subjective    HPI  Hypertension, follow-up  BP Readings from Last 3 Encounters:  04/05/20 138/66  12/01/19 125/68  09/23/19 (!) 139/73   Wt Readings from Last 3 Encounters:  04/05/20 169 lb (76.7 kg)  12/01/19 164 lb (74.4 kg)  09/23/19 168 lb 12.8 oz (76.6 kg)     He was last seen for hypertension 4 months ago.  BP at that visit was 125/68. Management since that visit includes continue current medication.  He reports good compliance with treatment. He is not having side effects.  He is following a Regular, Low Sodium diet. He is exercising. He does not smoke.  Use of agents associated with hypertension: none.   Outside blood pressures are not being checking. Symptoms: No chest pain No chest pressure  No palpitations No syncope  No dyspnea No orthopnea  No paroxysmal nocturnal dyspnea No lower extremity edema   Pertinent labs: Lab Results  Component Value Date   CHOL 161 12/01/2019   HDL 36 (L) 12/01/2019   LDLCALC 110 (H) 12/01/2019   TRIG 77 12/01/2019   CHOLHDL 4.5 12/01/2019   Lab Results  Component Value Date   NA 139 12/01/2019   K 4.0 12/01/2019   CREATININE 1.09 12/01/2019   GFRNONAA 68 12/01/2019   GFRAA 79 12/01/2019   GLUCOSE 93 12/01/2019     The 10-year ASCVD risk score [name], et al., 2013) is: 27.5%   ---------------------------------------------------------------------------------------------------  Lipid/Cholesterol, Follow-up  Last lipid panel  Other pertinent labs  Lab Results  Component Value Date   CHOL 161 12/01/2019   HDL 36 (L) 12/01/2019   LDLCALC 110 (H) 12/01/2019   TRIG 77 12/01/2019   CHOLHDL 4.5 12/01/2019   Lab Results  Component Value Date   ALT 17 12/01/2019   AST 17 12/01/2019   PLT 198 12/01/2019   TSH 5.510 (H) 08/23/2019     He was last seen for this 4 months ago.  Management since that visit includes continue crestor 10 mg QHS. He was previously on Lipitor 80 mg QHS but experienced severe cramping. This resolved after changing to crestor.   He reports excellent compliance with treatment. He is not having side effects. Leg cramps have resolved.   Symptoms: No chest pain No chest pressure/discomfort  No dyspnea No lower extremity edema  No numbness or tingling of extremity No orthopnea  No palpitations No paroxysmal nocturnal dyspnea  No speech difficulty No syncope   Current diet: in general, a "healthy" diet   Current exercise: aerobics  The 10-year ASCVD risk score [name], et al., 2013) is: 27.5%  ---------------------------------------------------------------------------------------------------     Medications: Outpatient Medications Prior to Visit  Medication Sig  . aspirin 81 MG EC tablet Take by mouth.  . Coenzyme Q10 (CO Q-10) 100 MG CAPS Take 1 capsule by mouth daily.  . Denture Care Products (EFFERDENT DENTURE CLEANSER) TBEF   . dutasteride (AVODART) 0.5 MG capsule Takes on Monday, Wednesday and Friday  .  losartan-hydrochlorothiazide (HYZAAR) 50-12.5 MG tablet TAKE 1 TABLET EVERY DAY  . Magnesium 400 MG TABS Take by mouth.  . Omega-3 Fatty Acids (FISH OIL) 1000 MG CAPS Take 2,000 mg by mouth daily.   [name] Kitchen omeprazole (PRILOSEC) 20 MG capsule Take 1 capsule (20 mg total) by mouth daily.  . rosuvastatin (CRESTOR) 10 MG tablet Take 1 tablet (10 mg total) by mouth daily.  . sildenafil (REVATIO) 20 MG tablet TAKE 1 TO 5 TABLETS AS NEEDED 1 HOUR PRIOR TO INTERCOURSE  . traZODone  (DESYREL) 100 MG tablet TAKE 2 TABLETS EVERY DAY   No facility-administered medications prior to visit.    Review of Systems  Constitutional: Negative.   Respiratory: Negative.   Hematological: Negative.        Objective    BP 138/66 (BP Location: Left Arm, Patient Position: Sitting, Cuff Size: Large)   Pulse 62   Temp (!) 97.5 F (36.4 C) (Oral)   Ht 5\' 6"  (1.676 m)   Wt 169 lb (76.7 kg)   SpO2 100%   BMI 27.28 kg/m     Physical Exam Constitutional:      Appearance: Normal appearance. He is normal weight.  Cardiovascular:     Rate and Rhythm: Normal rate and regular rhythm.     Heart sounds: Normal heart sounds.  Pulmonary:     Effort: Pulmonary effort is normal.     Breath sounds: Normal breath sounds.  Skin:    General: Skin is warm and dry.  Neurological:     General: No focal deficit present.     Mental Status: He is alert and oriented to person, place, and time. Mental status is at baseline.  Psychiatric:        Mood and Affect: Mood normal.        Behavior: Behavior normal.       No results found for any visits on 04/05/20.  Assessment & Plan    1. Hyperlipidemia, unspecified hyperlipidemia type  Continue crestor, check below.   - Lipid Profile  2. Need for shingles vaccine  Recommend shingrix, suggested he call insurance and see where this is paid for.   3. Benign hypertension  Continue medications.    Return in about 6 months (around 10/03/2020) for chronic .      ITrinna Post, PA-C, have reviewed all documentation for this visit. The documentation on 04/05/20 for the exam, diagnosis, procedures, and orders are all accurate and complete.  The entirety of the information documented in the History of Present Illness, Review of Systems and Physical Exam were personally obtained by me. Portions of this information were initially documented by [facility] and reviewed by me for thoroughness and accuracy.     Paulene Floor  [facility] [phone] (phone) [phone] (fax)  [place]

## 2020-04-05 ENCOUNTER — Other Ambulatory Visit: Payer: Self-pay

## 2020-04-05 ENCOUNTER — Ambulatory Visit (INDEPENDENT_AMBULATORY_CARE_PROVIDER_SITE_OTHER): Payer: Medicare HMO | Admitting: Physician Assistant

## 2020-04-05 VITALS — BP 138/66 | HR 62 | Temp 97.5°F | Ht 66.0 in | Wt 169.0 lb

## 2020-04-05 DIAGNOSIS — Z23 Encounter for immunization: Secondary | ICD-10-CM

## 2020-04-05 DIAGNOSIS — I10 Essential (primary) hypertension: Secondary | ICD-10-CM

## 2020-04-05 DIAGNOSIS — E785 Hyperlipidemia, unspecified: Secondary | ICD-10-CM

## 2020-04-05 NOTE — Patient Instructions (Addendum)
Highly recommend shingrix, check on insurance coverage.     High Cholesterol  High cholesterol is a condition in which the blood has high levels of a white, waxy substance similar to fat (cholesterol). The liver makes all the cholesterol that the body needs. The human body needs small amounts of cholesterol to help build cells. A person gets extra or excess cholesterol from the food that he or she eats. The blood carries cholesterol from the liver to the rest of the body. If you have high cholesterol, deposits (plaques) may build up on the walls of your arteries. Arteries are the blood vessels that carry blood away from your heart. These plaques make the arteries narrow and stiff. Cholesterol plaques increase your risk for heart attack and stroke. Work with your health care provider to keep your cholesterol levels in a healthy range. What increases the risk? The following factors may make you more likely to develop this condition:  Eating foods that are high in animal fat (saturated fat) or cholesterol.  Being overweight.  Not getting enough exercise.  A family history of high cholesterol (familial hypercholesterolemia).  Use of tobacco products.  Having diabetes. What are the signs or symptoms? There are no symptoms of this condition. How is this diagnosed? This condition may be diagnosed based on the results of a blood test.  If you are older than 73 years of age, your health care provider may check your cholesterol levels every 4-6 years.  You may be checked more often if you have high cholesterol or other risk factors for heart disease. The blood test for cholesterol measures:  "Bad" cholesterol, or LDL cholesterol. This is the main type of cholesterol that causes heart disease. The desired level is less than 100 mg/dL.  "Good" cholesterol, or HDL cholesterol. HDL helps protect against heart disease by cleaning the arteries and carrying the LDL to the liver for processing. The  desired level for HDL is 60 mg/dL or higher.  Triglycerides. These are fats that your body can store or burn for energy. The desired level is less than 150 mg/dL.  Total cholesterol. This measures the total amount of cholesterol in your blood and includes LDL, HDL, and triglycerides. The desired level is less than 200 mg/dL. How is this treated? This condition may be treated with:  Diet changes. You may be asked to eat foods that have more fiber and less saturated fats or added sugar.  Lifestyle changes. These may include regular exercise, maintaining a healthy weight, and quitting use of tobacco products.  Medicines. These are given when diet and lifestyle changes have not worked. You may be prescribed a statin medicine to help lower your cholesterol levels. Follow these instructions at home: Eating and drinking  Eat a healthy, balanced diet. This diet includes: ? Daily servings of a variety of fresh, frozen, or canned fruits and vegetables. ? Daily servings of whole grain foods that are rich in fiber. ? Foods that are low in saturated fats and trans fats. These include poultry and fish without skin, lean cuts of meat, and low-fat dairy products. ? A variety of fish, especially oily fish that contain omega-3 fatty acids. Aim to eat fish at least 2 times a week.  Avoid foods and drinks that have added sugar.  Use healthy cooking methods, such as roasting, grilling, broiling, baking, poaching, steaming, and stir-frying. Do not fry your food except for stir-frying.   Lifestyle  Get regular exercise. Aim to exercise for a total of  150 minutes a week. Increase your activity level by doing activities such as gardening, walking, and taking the stairs.  Do not use any products that contain nicotine or tobacco, such as cigarettes, e-cigarettes, and chewing tobacco. If you need help quitting, ask your health care provider.   General instructions  Take over-the-counter and prescription  medicines only as told by your health care provider.  Keep all follow-up visits as told by your health care provider. This is important. Where to find more information  American Heart Association: www.heart.org  National Heart, Lung, and Blood Institute: https://wilson-eaton.com/ Contact a health care provider if:  You have trouble achieving or maintaining a healthy diet or weight.  You are starting an exercise program.  You are unable to stop smoking. Get help right away if:  You have chest pain.  You have trouble breathing.  You have any symptoms of a stroke. "BE FAST" is an easy way to remember the main warning signs of a stroke: ? B - Balance. Signs are dizziness, sudden trouble walking, or loss of balance. ? E - Eyes. Signs are trouble seeing or a sudden change in vision. ? F - Face. Signs are sudden weakness or numbness of the face, or the face or eyelid drooping on one side. ? A - Arms. Signs are weakness or numbness in an arm. This happens suddenly and usually on one side of the body. ? S - Speech. Signs are sudden trouble speaking, slurred speech, or trouble understanding what people say. ? T - Time. Time to call emergency services. Write down what time symptoms started.  You have other signs of a stroke, such as: ? A sudden, severe headache with no known cause. ? Nausea or vomiting. ? Seizure. These symptoms may represent a serious problem that is an emergency. Do not wait to see if the symptoms will go away. Get medical help right away. Call your local emergency services (911 in the [place] Do not drive yourself to the hospital. Summary  Cholesterol plaques increase your risk for heart attack and stroke. Work with your health care provider to keep your cholesterol levels in a healthy range.  Eat a healthy, balanced diet, get regular exercise, and maintain a healthy weight.  Do not use any products that contain nicotine or tobacco, such as cigarettes, e-cigarettes, and  chewing tobacco.  Get help right away if you have any symptoms of a stroke. This information is not intended to replace advice given to you by your health care provider. Make sure you discuss any questions you have with your health care provider. Document Revised: 01/10/2019 Document Reviewed: 01/10/2019 Elsevier Patient Education  2021 [employer].

## 2020-04-06 ENCOUNTER — Telehealth: Payer: Self-pay

## 2020-04-06 DIAGNOSIS — E7800 Pure hypercholesterolemia, unspecified: Secondary | ICD-10-CM

## 2020-04-06 LAB — LIPID PANEL
Chol/HDL Ratio: 4 ratio (ref 0.0–5.0)
Cholesterol, Total: 196 mg/dL (ref 100–199)
HDL: 49 mg/dL (ref >39)
LDL Chol Calc (NIH): 132 mg/dL — ABNORMAL HIGH (ref 0–99)
Triglycerides: 81 mg/dL (ref 0–149)
VLDL Cholesterol Cal: 15 mg/dL (ref 5–40)

## 2020-04-06 MED ORDER — ROSUVASTATIN CALCIUM 20 MG PO TABS: 20.0000 mg | ORAL_TABLET | Freq: Every day | ORAL | 0 refills | Status: DC

## 2020-04-06 MED ORDER — ROSUVASTATIN CALCIUM 20 MG PO TABS: 20.0000 mg | ORAL_TABLET | Freq: Every day | ORAL | 1 refills | Status: DC

## 2020-04-06 NOTE — Telephone Encounter (Signed)
Patient was advised and states it is fine to increase his Crestor dose. Also he would like to know if he should come back after changing the dose of the medication. Please advise.

## 2020-04-06 NOTE — Telephone Encounter (Signed)
Sent 90 days to mail order. Yes 6-12 week follow up thank you.

## 2020-04-06 NOTE — Telephone Encounter (Signed)
-----   Message from Trinna Post, [place] sent at 04/06/2020  9:52 AM EST ----- Can we let him know his cholesterol is a bit up from last time, with LDL at 132. We can consider increasing his crestor if he is interested.

## 2020-04-09 NOTE — Telephone Encounter (Signed)
Patient was advised and scheduled appointment for 06/21/2020.

## 2020-05-14 ENCOUNTER — Telehealth: Payer: Self-pay

## 2020-05-14 NOTE — Telephone Encounter (Signed)
Patient was scheduled with Adriana at 8 am 04/28. Rescheduled patient with Vernie Murders 04/29 at 8:40am patient wants an 8 am appointment please. [place] appointments for 8 am and 8:20am are same day appt. If this is ok to change patient to 8am can someone please call patient. Thanks.I can't override.

## 2020-06-07 ENCOUNTER — Other Ambulatory Visit: Payer: Self-pay | Admitting: Urology

## 2020-06-07 DIAGNOSIS — N5201 Erectile dysfunction due to arterial insufficiency: Secondary | ICD-10-CM

## 2020-06-12 ENCOUNTER — Other Ambulatory Visit: Payer: Self-pay | Admitting: Family Medicine

## 2020-06-12 DIAGNOSIS — R972 Elevated prostate specific antigen [PSA]: Secondary | ICD-10-CM

## 2020-06-21 ENCOUNTER — Ambulatory Visit: Payer: Self-pay | Admitting: Physician Assistant

## 2020-06-22 ENCOUNTER — Ambulatory Visit: Payer: Self-pay | Admitting: Family Medicine

## 2020-06-25 NOTE — Progress Notes (Signed)
I,April Miller,acting as a scribe for Wilhemena Durie, MD.,have documented all relevant documentation on the behalf of Wilhemena Durie, MD,as directed by  Wilhemena Durie, MD while in the presence of Wilhemena Durie, MD.   Established patient visit   Patient: Michael [name].   DOB: [birth year]   75 y.o. Male  MRN: [id number] Visit Date: 06/26/2020  Today's healthcare provider: Wilhemena Durie, MD   Chief Complaint  Patient presents with  . Follow-up  . Hyperlipidemia   Subjective    HPI  Patient comes in today for follow-up.  He states his home blood pressures are good and in the 1 87-5 30 range systolic is taking medications as prescribed.  He has no complaints. Lipid/Cholesterol, Follow-up  Last lipid panel Other pertinent labs  Lab Results  Component Value Date   CHOL 196 04/05/2020   HDL 49 04/05/2020   LDLCALC 132 (H) 04/05/2020   TRIG 81 04/05/2020   CHOLHDL 4.0 04/05/2020   Lab Results  Component Value Date   ALT 17 12/01/2019   AST 17 12/01/2019   PLT 198 12/01/2019   TSH 5.510 (H) 08/23/2019     He was last seen for this 3 months ago.  Management since that visit includes no medication changes.   He reports good compliance with treatment. He is not having side effects. none Current diet: well balanced Current exercise: walking  The 10-year ASCVD risk score [name], et al., 2013) is: 22.8%      Medications: Outpatient Medications Prior to Visit  Medication Sig  . aspirin 81 MG EC tablet Take by mouth.  . Coenzyme Q10 (CO Q-10) 100 MG CAPS Take 1 capsule by mouth daily.  . Denture Care Products (EFFERDENT DENTURE CLEANSER) TBEF   . dutasteride (AVODART) 0.5 MG capsule Takes on Monday, Wednesday and Friday  . losartan-hydrochlorothiazide (HYZAAR) 50-12.5 MG tablet TAKE 1 TABLET EVERY DAY  . Magnesium 400 MG TABS Take by mouth.  . Omega-3 Fatty Acids (FISH OIL) 1000 MG CAPS Take 2,000 mg by mouth daily.   [name] Kitchen omeprazole  (PRILOSEC) 20 MG capsule Take 1 capsule (20 mg total) by mouth daily.  . rosuvastatin (CRESTOR) 20 MG tablet Take 1 tablet (20 mg total) by mouth daily.  . sildenafil (REVATIO) 20 MG tablet TAKE 1 TO 5 TABLETS AS NEEDED 1 HOUR PRIOR TO INTERCOURSE  . traZODone (DESYREL) 100 MG tablet TAKE 2 TABLETS EVERY DAY   No facility-administered medications prior to visit.    Review of Systems  Constitutional: Negative for appetite change, chills and fever.  Respiratory: Negative for chest tightness, shortness of breath and wheezing.   Cardiovascular: Negative for chest pain and palpitations.  Gastrointestinal: Negative for abdominal pain, nausea and vomiting.        Objective    BP 125/76 (BP Location: Left Arm, Patient Position: Sitting, Cuff Size: Large)   Pulse 61   Temp 98 F (36.7 C) (Oral)   Resp 16   Wt 170 lb (77.1 kg)   SpO2 97%   BMI 27.44 kg/m  BP Readings from Last 3 Encounters:  06/26/20 125/76  04/05/20 138/66  12/01/19 125/68   Wt Readings from Last 3 Encounters:  06/26/20 170 lb (77.1 kg)  04/05/20 169 lb (76.7 kg)  12/01/19 164 lb (74.4 kg)       Physical Exam Vitals reviewed.  Constitutional:      General: He is not in acute distress.  Appearance: He is well-developed.  HENT:     Head: Normocephalic and atraumatic.     Right Ear: Hearing and tympanic membrane normal.     Left Ear: Hearing and tympanic membrane normal.     Nose: Nose normal.     Mouth/Throat:     Pharynx: Oropharynx is clear.  Eyes:     General: Lids are normal. No scleral icterus.       Right eye: No discharge.        Left eye: No discharge.     Conjunctiva/sclera: Conjunctivae normal.  Cardiovascular:     Rate and Rhythm: Normal rate and regular rhythm.  Pulmonary:     Effort: Pulmonary effort is normal. No respiratory distress.     Breath sounds: Normal breath sounds.  Abdominal:     General: Bowel sounds are normal.     Palpations: Abdomen is soft.  Musculoskeletal:      Cervical back: Neck supple.     Right lower leg: No edema.     Left lower leg: No edema.  Skin:    General: Skin is warm and dry.     Findings: No lesion or rash.  Neurological:     General: No focal deficit present.     Mental Status: He is alert and oriented to person, place, and time.  Psychiatric:        Mood and Affect: Mood normal.        Speech: Speech normal.        Behavior: Behavior normal.        Thought Content: Thought content normal.        Judgment: Judgment normal.       No results found for any visits on 06/26/20.  Assessment & Plan     1. Benign hypertension Well-controlled on Hyzaar.  See dentist in 6 months for full physical - Lipid panel - Comprehensive Metabolic Panel (CMET)  2. Hypercholesteremia On rosuvastatin - Lipid panel - Comprehensive Metabolic Panel (CMET)  3. Anxiety Clinically stable.  Trazodone for sleep. - Lipid panel - Comprehensive Metabolic Panel (CMET)   No follow-ups on file.      I, Wilhemena Durie, MD, have reviewed all documentation for this visit. The documentation on 06/29/20 for the exam, diagnosis, procedures, and orders are all accurate and complete.    Hadi Dubin Cranford Mon, MD  [facility] [phone] (phone) [phone] (fax)  [place]

## 2020-06-26 ENCOUNTER — Ambulatory Visit (INDEPENDENT_AMBULATORY_CARE_PROVIDER_SITE_OTHER): Payer: Medicare HMO | Admitting: Family Medicine

## 2020-06-26 ENCOUNTER — Other Ambulatory Visit: Payer: Self-pay

## 2020-06-26 ENCOUNTER — Encounter: Payer: Self-pay | Admitting: Family Medicine

## 2020-06-26 VITALS — BP 125/76 | HR 61 | Temp 98.0°F | Resp 16 | Wt 170.0 lb

## 2020-06-26 DIAGNOSIS — E7800 Pure hypercholesterolemia, unspecified: Secondary | ICD-10-CM

## 2020-06-26 DIAGNOSIS — I10 Essential (primary) hypertension: Secondary | ICD-10-CM | POA: Diagnosis not present

## 2020-06-26 DIAGNOSIS — F419 Anxiety disorder, unspecified: Secondary | ICD-10-CM

## 2020-06-27 LAB — LIPID PANEL
Chol/HDL Ratio: 3.6 ratio (ref 0.0–5.0)
Cholesterol, Total: 166 mg/dL (ref 100–199)
HDL: 46 mg/dL (ref >39)
LDL Chol Calc (NIH): 105 mg/dL — ABNORMAL HIGH (ref 0–99)
Triglycerides: 77 mg/dL (ref 0–149)
VLDL Cholesterol Cal: 15 mg/dL (ref 5–40)

## 2020-06-27 LAB — COMPREHENSIVE METABOLIC PANEL
ALT: 15 IU/L (ref 0–44)
AST: 19 IU/L (ref 0–40)
Albumin/Globulin Ratio: 2.2 (ref 1.2–2.2)
Albumin: 4.6 g/dL (ref 3.7–4.7)
Alkaline Phosphatase: 55 IU/L (ref 44–121)
BUN/Creatinine Ratio: 14 (ref 10–24)
BUN: 18 mg/dL (ref 8–27)
Bilirubin Total: 0.6 mg/dL (ref 0.0–1.2)
CO2: 24 mmol/L (ref 20–29)
Calcium: 9.6 mg/dL (ref 8.6–10.2)
Chloride: 101 mmol/L (ref 96–106)
Creatinine, Ser: 1.26 mg/dL (ref 0.76–1.27)
Globulin, Total: 2.1 g/dL (ref 1.5–4.5)
Glucose: 104 mg/dL — ABNORMAL HIGH (ref 65–99)
Potassium: 4 mmol/L (ref 3.5–5.2)
Sodium: 140 mmol/L (ref 134–144)
Total Protein: 6.7 g/dL (ref 6.0–8.5)
eGFR: 61 mL/min/{1.73_m2} (ref >59)

## 2020-07-02 ENCOUNTER — Telehealth: Payer: Self-pay

## 2020-07-02 NOTE — Telephone Encounter (Signed)
Copied from [place] [id number]. Topic: General - Other >> Jul 02, 2020 11:41 AM Alanda Slim E wrote: Reason for CRM: Pt wife called to have someone speak with her about pts lab results / please advise

## 2020-07-03 ENCOUNTER — Ambulatory Visit: Payer: Self-pay | Admitting: Family Medicine

## 2020-07-06 NOTE — Telephone Encounter (Signed)
I called patient and patient verbalized understanding of information below.   

## 2020-07-06 NOTE — Telephone Encounter (Signed)
He was seen by Dr. Rosanna Randy when these labs were done on 06-26-20. LDL cholesterol is improving with the Rosuvastatin. Should continue low fat diet and regular exercise by walking 30-40 minutes 3-4 days a week. Recheck levels in 4 months.

## 2020-07-30 ENCOUNTER — Other Ambulatory Visit: Payer: Self-pay

## 2020-07-30 ENCOUNTER — Other Ambulatory Visit: Payer: Medicare HMO

## 2020-07-30 DIAGNOSIS — R972 Elevated prostate specific antigen [PSA]: Secondary | ICD-10-CM | POA: Diagnosis not present

## 2020-07-31 LAB — PSA: Prostate Specific Ag, Serum: 2.2 ng/mL (ref 0.0–4.0)

## 2020-08-02 ENCOUNTER — Encounter: Payer: Self-pay | Admitting: Urology

## 2020-08-02 ENCOUNTER — Ambulatory Visit (INDEPENDENT_AMBULATORY_CARE_PROVIDER_SITE_OTHER): Payer: Medicare HMO | Admitting: Urology

## 2020-08-02 ENCOUNTER — Other Ambulatory Visit: Payer: Self-pay

## 2020-08-02 VITALS — BP 125/71 | HR 70 | Ht 66.0 in | Wt 165.0 lb

## 2020-08-02 DIAGNOSIS — R972 Elevated prostate specific antigen [PSA]: Secondary | ICD-10-CM

## 2020-08-02 DIAGNOSIS — N5201 Erectile dysfunction due to arterial insufficiency: Secondary | ICD-10-CM | POA: Diagnosis not present

## 2020-08-02 DIAGNOSIS — N401 Enlarged prostate with lower urinary tract symptoms: Secondary | ICD-10-CM

## 2020-08-02 MED ORDER — SILDENAFIL CITRATE 20 MG PO TABS: ORAL_TABLET | ORAL | 1 refills | Status: DC
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
## 2020-08-06 ENCOUNTER — Telehealth: Payer: Self-pay

## 2020-08-06 ENCOUNTER — Ambulatory Visit: Payer: Self-pay | Admitting: *Deleted

## 2020-08-06 DIAGNOSIS — K219 Gastro-esophageal reflux disease without esophagitis: Secondary | ICD-10-CM

## 2020-08-06 MED ORDER — OMEPRAZOLE 20 MG PO CPDR: 20.0000 mg | DELAYED_RELEASE_CAPSULE | Freq: Every day | ORAL | Status: DC

## 2020-08-06 NOTE — Telephone Encounter (Signed)
[facility]) Pharmacy faxed refill request for the following medications:  omeprazole (PRILOSEC) 20 MG capsule   Please advise.

## 2020-08-06 NOTE — Telephone Encounter (Signed)
Had a constipated BM with a great deal of gas and the abdominal discomfort has abated. States he has a history of diverticulosis but denies fever or bloody stools. Encouraged to drink more water in diet and reduce the amount of corn he eats. Continue with low residue diet and follow up appointment if discomfort returns.

## 2020-08-06 NOTE — Telephone Encounter (Signed)
Reason for Disposition  [1] MODERATE pain (e.g., interferes with normal activities) AND [2] pain comes and goes (cramps) AND [3] present > 24 hours  (Exception: pain with Vomiting or Diarrhea - see that Guideline)  Answer Assessment - Initial Assessment Questions 1. LOCATION: "Where does it hurt?"      At belly button 2. RADIATION: "Does the pain shoot anywhere else?" (e.g., chest, back)     no 3. ONSET: "When did the pain begin?" (Minutes, hours or days ago)      Few nights ago 4. SUDDEN: "Gradual or sudden onset?"     Gradual 5. PATTERN "Does the pain come and go, or is it constant?"    - If constant: "Is it getting better, staying the same, or worsening?"      (Note: Constant means the pain never goes away completely; most serious pain is constant and it progresses)     - If intermittent: "How long does it last?" "Do you have pain now?"     (Note: Intermittent means the pain goes away completely between bouts)     "Feels bloated"  "Sore to touch." 6. SEVERITY: "How bad is the pain?"  (e.g., Scale 1-10; mild, moderate, or severe)    - MILD (1-3): doesn't interfere with normal activities, abdomen soft and not tender to touch     - MODERATE (4-7): interferes with normal activities or awakens from sleep, abdomen tender to touch     - SEVERE (8-10): excruciating pain, doubled over, unable to do any normal activities       6/10 7. RECURRENT SYMPTOM: "Have you ever had this type of stomach pain before?" If Yes, ask: "When was the last time?" and "What happened that time?"      yes 8. CAUSE: "What do you think is causing the stomach pain?"     unsure 9. RELIEVING/AGGRAVATING FACTORS: "What makes it better or worse?" (e.g., movement, antacids, bowel movement)      10. OTHER SYMPTOMS: "Do you have any other symptoms?" (e.g., back pain, diarrhea, fever, urination pain, vomiting)       No  Protocols used: Abdominal Pain - Male-A-AH

## 2020-08-06 NOTE — Telephone Encounter (Signed)
Pt reports abdominal pain this AM. Rates at 6/10, intermittent. States at lower abdomen, "At belly button." Does not radiate. States "Sore when I press on that area. Denies any nausea or vomiting. "Been bloated. May not be drinking enough water, stools can be hard."State this AM "Decided to call my doctor when my stomach started growling and rumbling after eating. " Also reports episode of constipation 3-4 days ago, blood in stool at that time with straining, none since. Reports had colonoscopy in 2013, "Had polyps and diverticulum in sigmoid colon. Seems reluctant to triage. Requesting appt with D.Chrismon or Dr. Rosanna Randy only. No availability within protocol timeframe of 24 hours. Pt seems reluctant to triage further. States "I don't feel the need to go to an ED, just need to speak to a doctor." Assured pt NT would route to practice for PCPs review and final disposition.  Please advise: [phone]

## 2020-08-08 ENCOUNTER — Other Ambulatory Visit: Payer: Self-pay

## 2020-08-08 DIAGNOSIS — K219 Gastro-esophageal reflux disease without esophagitis: Secondary | ICD-10-CM

## 2020-08-08 MED ORDER — OMEPRAZOLE 20 MG PO CPDR: 20.0000 mg | DELAYED_RELEASE_CAPSULE | Freq: Every day | ORAL | 3 refills | Status: AC

## 2020-08-08 NOTE — Telephone Encounter (Signed)
Rx was sent to no print so the Rx wasn't sent to [facility]) Pharmacy. Please send.

## 2020-08-08 NOTE — Telephone Encounter (Signed)
Prescription has been resent

## 2020-08-09 ENCOUNTER — Other Ambulatory Visit: Payer: Self-pay | Admitting: Family Medicine

## 2020-08-09 DIAGNOSIS — F419 Anxiety disorder, unspecified: Secondary | ICD-10-CM

## 2020-10-19 ENCOUNTER — Telehealth: Payer: Self-pay

## 2020-10-19 DIAGNOSIS — E7800 Pure hypercholesterolemia, unspecified: Secondary | ICD-10-CM

## 2020-10-19 MED ORDER — ROSUVASTATIN CALCIUM 20 MG PO TABS
20.0000 mg | ORAL_TABLET | Freq: Every day | ORAL | 3 refills | Status: AC
Start: 2020-10-19 — End: ?

## 2020-10-19 NOTE — Telephone Encounter (Signed)
Patient stopped by saying Michael [name] has been trying to get the Rosuvastatin 20 mg.  for a week now.     Can you please send in his refills to [facility].

## 2020-11-12 DIAGNOSIS — Z20828 Contact with and (suspected) exposure to other viral communicable diseases: Secondary | ICD-10-CM | POA: Diagnosis not present

## 2020-12-06 ENCOUNTER — Other Ambulatory Visit: Payer: Self-pay | Admitting: Urology

## 2020-12-11 ENCOUNTER — Other Ambulatory Visit: Payer: Self-pay | Admitting: Urology

## 2020-12-11 ENCOUNTER — Other Ambulatory Visit: Payer: Self-pay | Admitting: *Deleted

## 2020-12-11 MED ORDER — DUTASTERIDE 0.5 MG PO CAPS: ORAL_CAPSULE | ORAL | 1 refills | Status: DC

## 2020-12-19 DIAGNOSIS — X32XXXA Exposure to sunlight, initial encounter: Secondary | ICD-10-CM | POA: Diagnosis not present

## 2020-12-19 DIAGNOSIS — Z85828 Personal history of other malignant neoplasm of skin: Secondary | ICD-10-CM | POA: Diagnosis not present

## 2020-12-19 DIAGNOSIS — D2262 Melanocytic nevi of left upper limb, including shoulder: Secondary | ICD-10-CM | POA: Diagnosis not present

## 2020-12-19 DIAGNOSIS — D2272 Melanocytic nevi of left lower limb, including hip: Secondary | ICD-10-CM | POA: Diagnosis not present

## 2020-12-19 DIAGNOSIS — D2261 Melanocytic nevi of right upper limb, including shoulder: Secondary | ICD-10-CM | POA: Diagnosis not present

## 2020-12-19 DIAGNOSIS — D485 Neoplasm of uncertain behavior of skin: Secondary | ICD-10-CM | POA: Diagnosis not present

## 2020-12-19 DIAGNOSIS — D0462 Carcinoma in situ of skin of left upper limb, including shoulder: Secondary | ICD-10-CM | POA: Diagnosis not present

## 2020-12-19 DIAGNOSIS — L570 Actinic keratosis: Secondary | ICD-10-CM | POA: Diagnosis not present

## 2020-12-25 DIAGNOSIS — D0462 Carcinoma in situ of skin of left upper limb, including shoulder: Secondary | ICD-10-CM | POA: Diagnosis not present

## 2020-12-27 ENCOUNTER — Ambulatory Visit: Payer: Self-pay | Admitting: Family Medicine

## 2020-12-28 IMAGING — US ULTRASOUND ABDOMEN COMPLETE
1 series · 14 of 25 positions shown · non-contrast
Comparison: None.

CLINICAL DATA: RIGHT-sided abdominal pain, nausea and vomiting for
6 days.

EXAM:
ABDOMEN ULTRASOUND COMPLETE

[Series 1: ultrasound abdomen complete · 14 of 87 slices shown]
[im 1/87]
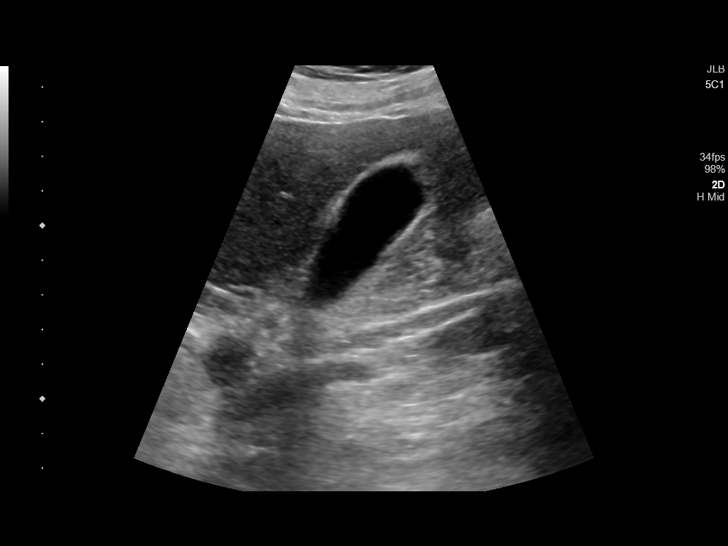
[im 8/87]
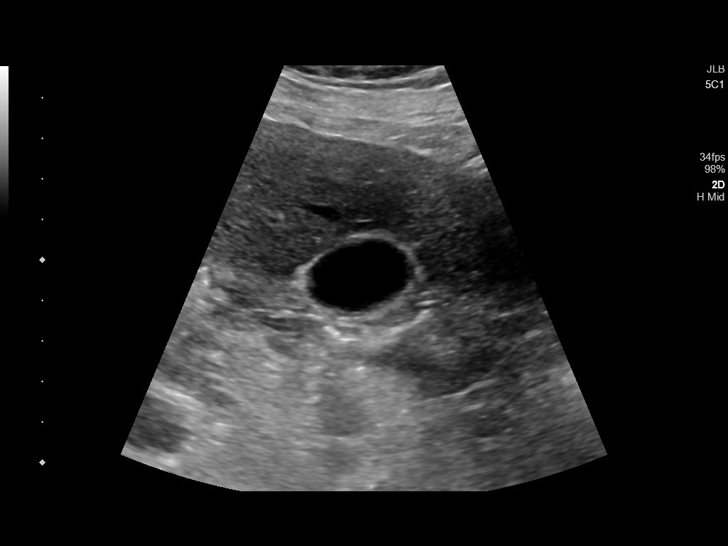
[im 15/87]
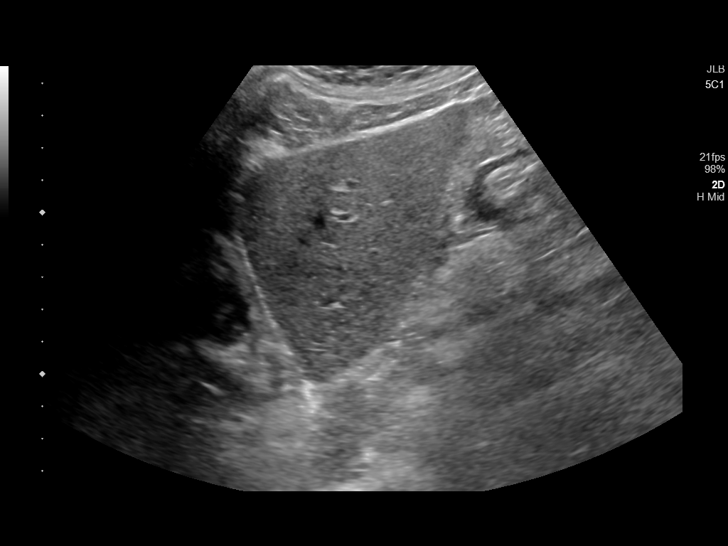
[im 22/87]
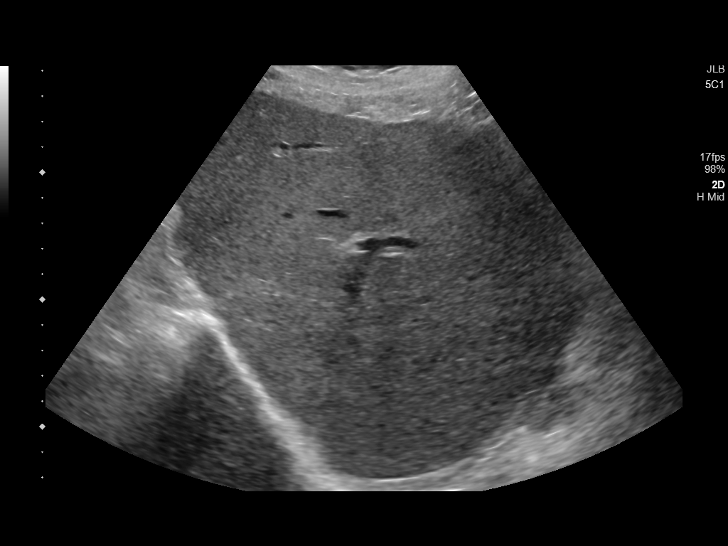
[im 29/87]
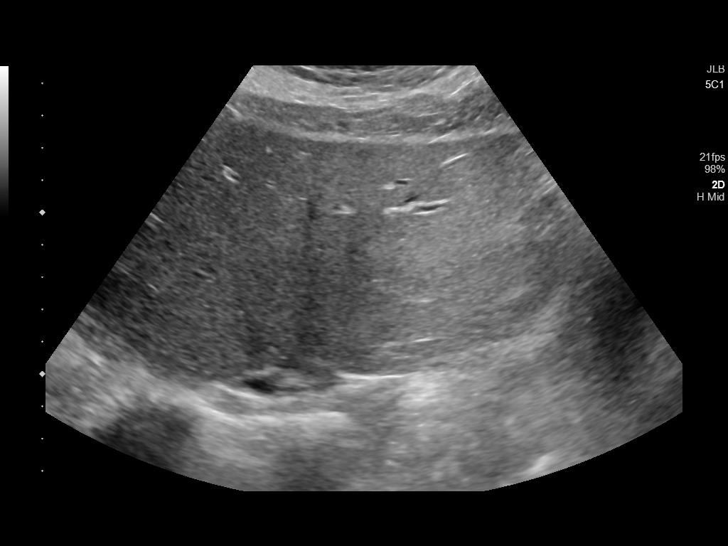
[im 33/87]
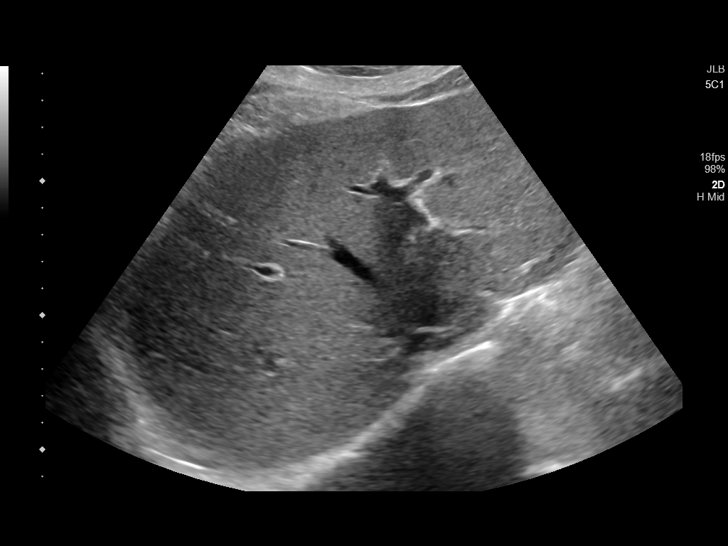
[im 40/87]
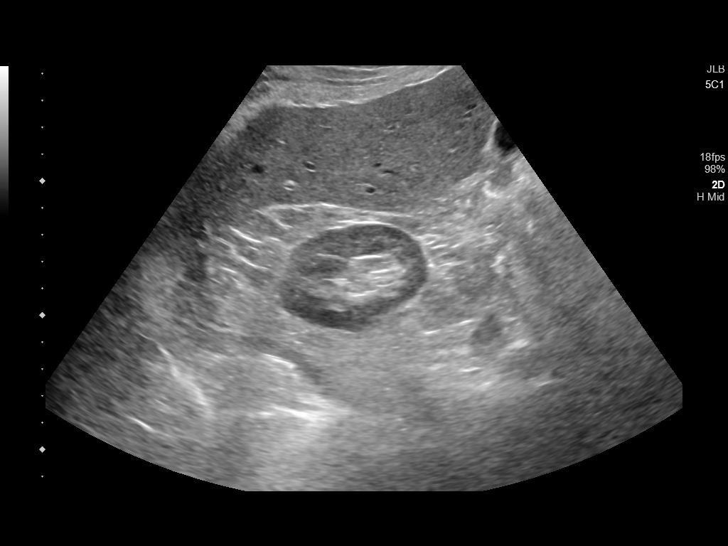
[im 47/87]
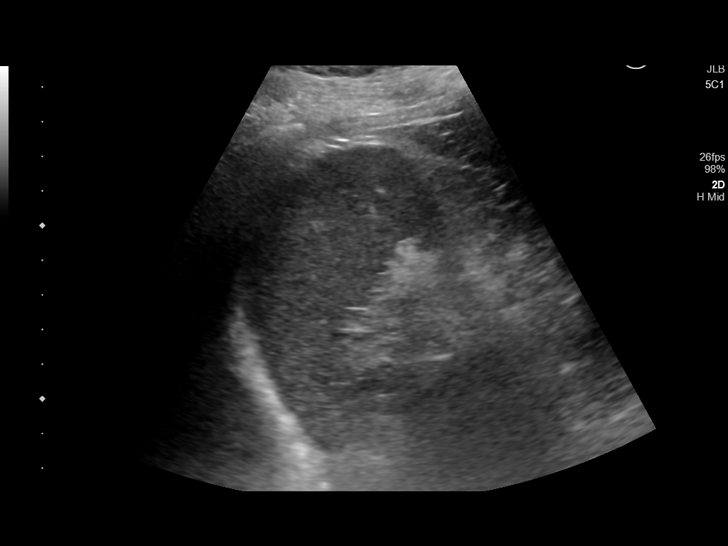
[im 54/87]
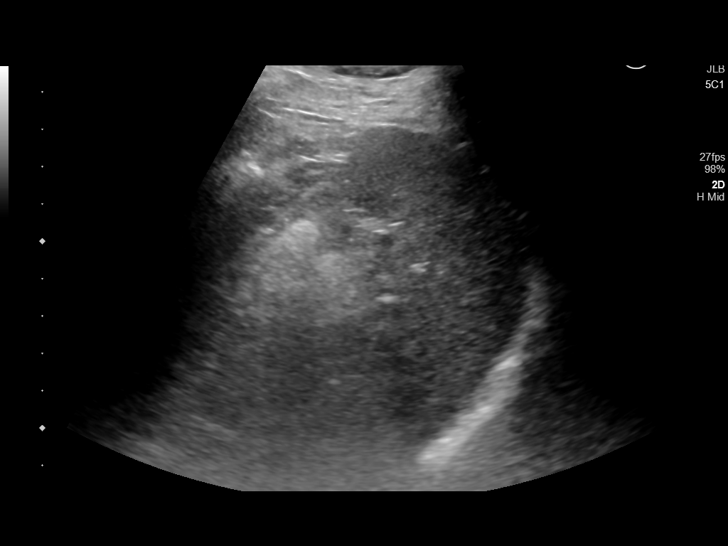
[im 58/87]
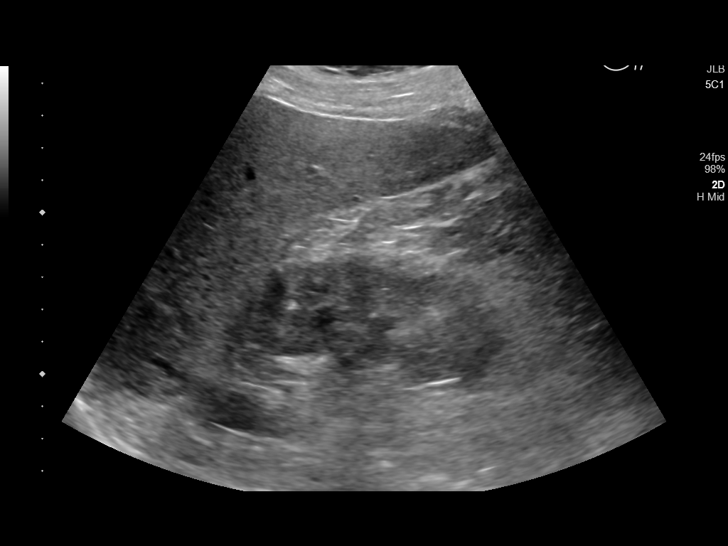
[im 65/87]
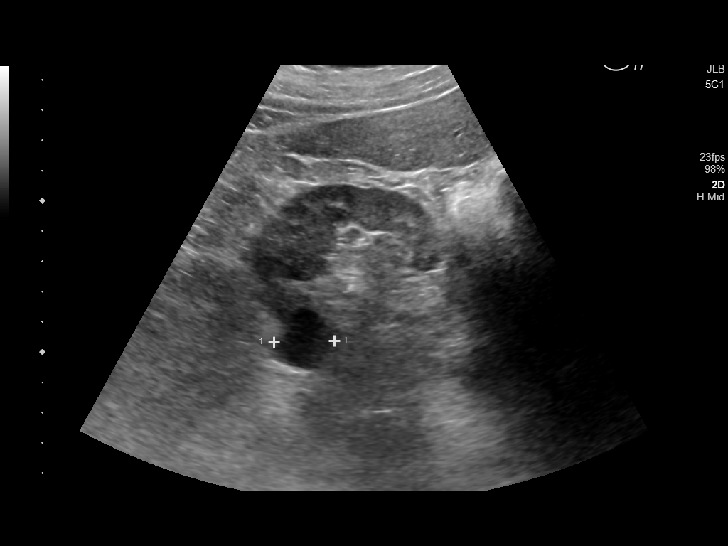
[im 72/87]
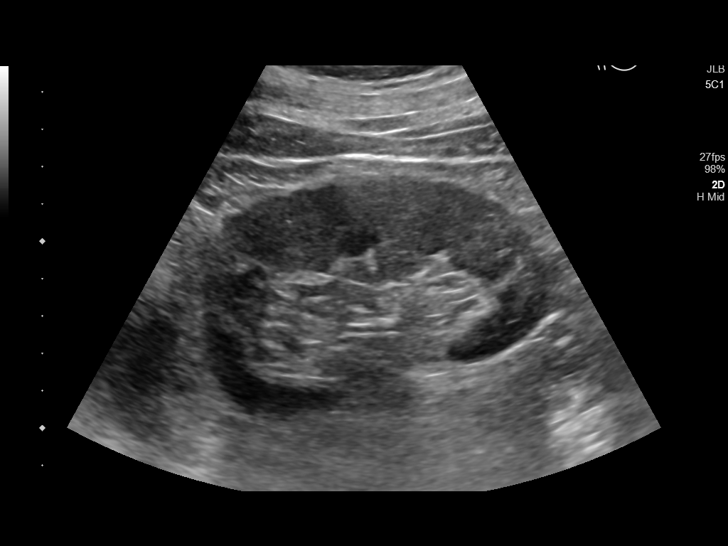
[im 79/87]
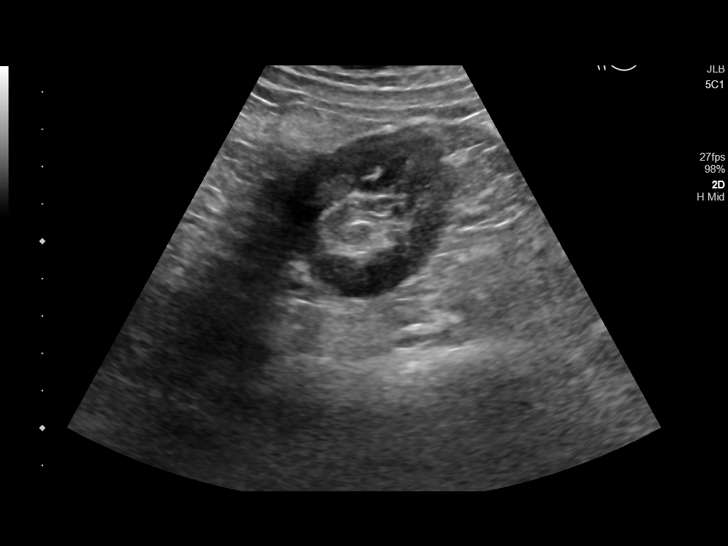
[im 87/87]
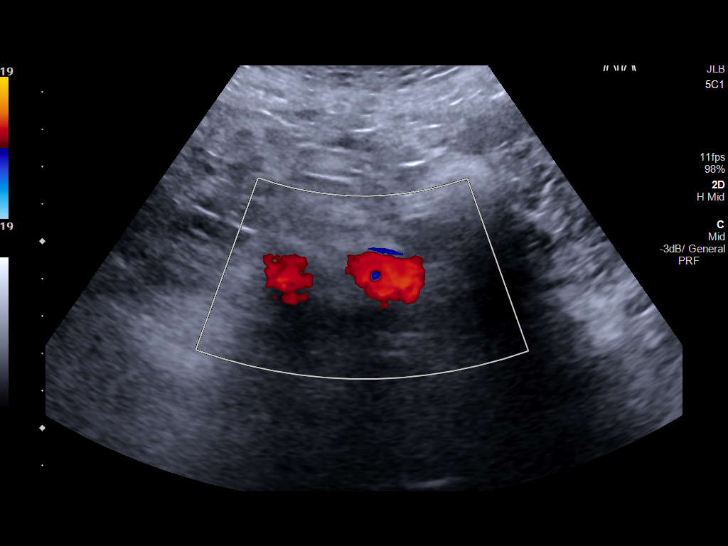

[14 of 25 positions shown; findings below may reference images not displayed]

FINDINGS: Gallbladder: No gallstones or wall thickening visualized. No
sonographic Murphy sign noted by sonographer.

Common bile duct: Diameter: 3 mm

Liver: No focal lesion identified. Within normal limits in
parenchymal echogenicity. Portal vein is patent on color Doppler
imaging with normal direction of blood flow towards the liver.

IVC: No abnormality visualized.

Pancreas: Visualized portion unremarkable.

Spleen: Size and appearance within normal limits.

Right Kidney: Length: 10.9 cm. Echogenicity within normal limits. 2
cm cyst. No suspicious mass or hydronephrosis visualized.

Left Kidney: Length: 9.9 cm. Echogenicity within normal limits. No
mass or hydronephrosis visualized.

Abdominal aorta: No aneurysm visualized.  Aortic atherosclerosis.

Other findings: None.
IMPRESSION: 1. No acute findings. No evidence of cholecystitis. No gallstones.
No bile duct dilatation.
2. Aortic atherosclerosis.

## 2021-01-07 ENCOUNTER — Telehealth: Payer: Self-pay | Admitting: Family Medicine

## 2021-01-07 DIAGNOSIS — F419 Anxiety disorder, unspecified: Secondary | ICD-10-CM

## 2021-01-07 DIAGNOSIS — I10 Essential (primary) hypertension: Secondary | ICD-10-CM

## 2021-01-07 MED ORDER — TRAZODONE HCL 100 MG PO TABS: 200.0000 mg | ORAL_TABLET | Freq: Every day | ORAL | 0 refills | Status: DC

## 2021-01-07 MED ORDER — LOSARTAN POTASSIUM-HCTZ 50-12.5 MG PO TABS: 1.0000 | ORAL_TABLET | Freq: Every day | ORAL | 3 refills | Status: AC

## 2021-01-07 NOTE — Telephone Encounter (Signed)
[facility] faxed refill request for the following medications:   losartan-hydrochlorothiazide (HYZAAR) 50-12.5 MG tablet   traZODone (DESYREL) 100 MG tablet   Please advise.

## 2021-03-18 DIAGNOSIS — E7800 Pure hypercholesterolemia, unspecified: Secondary | ICD-10-CM | POA: Diagnosis not present

## 2021-03-18 DIAGNOSIS — F419 Anxiety disorder, unspecified: Secondary | ICD-10-CM | POA: Diagnosis not present

## 2021-03-18 DIAGNOSIS — G479 Sleep disorder, unspecified: Secondary | ICD-10-CM | POA: Diagnosis not present

## 2021-03-18 DIAGNOSIS — R972 Elevated prostate specific antigen [PSA]: Secondary | ICD-10-CM | POA: Diagnosis not present

## 2021-03-18 DIAGNOSIS — I10 Essential (primary) hypertension: Secondary | ICD-10-CM | POA: Diagnosis not present

## 2021-03-30 ENCOUNTER — Other Ambulatory Visit: Payer: Self-pay | Admitting: Family Medicine

## 2021-03-30 DIAGNOSIS — F419 Anxiety disorder, unspecified: Secondary | ICD-10-CM

## 2021-04-01 NOTE — Telephone Encounter (Signed)
Attempted to call patient to schedule f/u appointment- left message on vM to call office. 30 day- Courtesy RF given Requested Prescriptions  Pending Prescriptions Disp Refills   traZODone (DESYREL) 100 MG tablet [Pharmacy Med Name: TRAZODONE HYDROCHLORIDE 100 MG Tablet] 60 tablet 0    Sig: TAKE 2 TABLETS EVERY DAY     Psychiatry: Antidepressants - Serotonin Modulator Failed - 03/30/2021 12:50 PM      Failed - Valid encounter within last 6 months    Recent Outpatient Visits          9 months ago Benign hypertension   [facility] Michael [name]., MD   12 months ago Hyperlipidemia, unspecified hyperlipidemia type   [facility] [place], Wendee Beavers, [place]   1 year ago [place], Vickki Muff, PA-C   1 year ago [place], [place], PA-C   1 year ago Back pain, unspecified back location, unspecified back pain laterality, unspecified chronicity   [place], Wendee Beavers, [place]      Future Appointments            In 4 months Michael [name], Michael [name], [place]

## 2021-04-18 DIAGNOSIS — E7800 Pure hypercholesterolemia, unspecified: Secondary | ICD-10-CM | POA: Diagnosis not present

## 2021-04-18 DIAGNOSIS — I10 Essential (primary) hypertension: Secondary | ICD-10-CM | POA: Diagnosis not present

## 2021-04-18 DIAGNOSIS — R0789 Other chest pain: Secondary | ICD-10-CM | POA: Diagnosis not present

## 2021-04-29 ENCOUNTER — Ambulatory Visit: Payer: Self-pay

## 2021-04-29 NOTE — Telephone Encounter (Signed)
Pt had called in saying they had missed call from Opal Sidles, only message I seen was from 04/01/21. I called pt back, LVM that if he had any additional questions to give office a call.  ?

## 2021-04-29 NOTE — Telephone Encounter (Signed)
Returned pt's call. Message left to call back to schedule appt. ?

## 2021-06-06 DIAGNOSIS — J069 Acute upper respiratory infection, unspecified: Secondary | ICD-10-CM | POA: Diagnosis not present

## 2021-07-10 DIAGNOSIS — E7800 Pure hypercholesterolemia, unspecified: Secondary | ICD-10-CM | POA: Diagnosis not present

## 2021-07-10 DIAGNOSIS — I10 Essential (primary) hypertension: Secondary | ICD-10-CM | POA: Diagnosis not present

## 2021-07-11 DIAGNOSIS — X32XXXA Exposure to sunlight, initial encounter: Secondary | ICD-10-CM | POA: Diagnosis not present

## 2021-07-11 DIAGNOSIS — D2261 Melanocytic nevi of right upper limb, including shoulder: Secondary | ICD-10-CM | POA: Diagnosis not present

## 2021-07-11 DIAGNOSIS — Z85828 Personal history of other malignant neoplasm of skin: Secondary | ICD-10-CM | POA: Diagnosis not present

## 2021-07-11 DIAGNOSIS — D485 Neoplasm of uncertain behavior of skin: Secondary | ICD-10-CM | POA: Diagnosis not present

## 2021-07-11 DIAGNOSIS — D0461 Carcinoma in situ of skin of right upper limb, including shoulder: Secondary | ICD-10-CM | POA: Diagnosis not present

## 2021-07-11 DIAGNOSIS — L570 Actinic keratosis: Secondary | ICD-10-CM | POA: Diagnosis not present

## 2021-07-11 DIAGNOSIS — D2262 Melanocytic nevi of left upper limb, including shoulder: Secondary | ICD-10-CM | POA: Diagnosis not present

## 2021-07-11 DIAGNOSIS — D2271 Melanocytic nevi of right lower limb, including hip: Secondary | ICD-10-CM | POA: Diagnosis not present

## 2021-07-17 DIAGNOSIS — E7800 Pure hypercholesterolemia, unspecified: Secondary | ICD-10-CM | POA: Diagnosis not present

## 2021-07-17 DIAGNOSIS — Z0000 Encounter for general adult medical examination without abnormal findings: Secondary | ICD-10-CM | POA: Diagnosis not present

## 2021-07-17 DIAGNOSIS — I10 Essential (primary) hypertension: Secondary | ICD-10-CM | POA: Diagnosis not present

## 2021-07-29 DIAGNOSIS — D0461 Carcinoma in situ of skin of right upper limb, including shoulder: Secondary | ICD-10-CM | POA: Diagnosis not present

## 2021-07-29 DIAGNOSIS — K116 Mucocele of salivary gland: Secondary | ICD-10-CM | POA: Diagnosis not present

## 2021-07-29 DIAGNOSIS — L570 Actinic keratosis: Secondary | ICD-10-CM | POA: Diagnosis not present

## 2021-08-01 ENCOUNTER — Other Ambulatory Visit: Payer: Self-pay

## 2021-08-01 DIAGNOSIS — R972 Elevated prostate specific antigen [PSA]: Secondary | ICD-10-CM

## 2021-08-02 ENCOUNTER — Other Ambulatory Visit: Payer: Medicare HMO

## 2021-08-02 ENCOUNTER — Other Ambulatory Visit: Payer: Self-pay

## 2021-08-02 DIAGNOSIS — R972 Elevated prostate specific antigen [PSA]: Secondary | ICD-10-CM | POA: Diagnosis not present

## 2021-08-03 LAB — PSA: Prostate Specific Ag, Serum: 2.3 ng/mL (ref 0.0–4.0)

## 2021-08-05 ENCOUNTER — Ambulatory Visit (INDEPENDENT_AMBULATORY_CARE_PROVIDER_SITE_OTHER): Payer: Medicare HMO | Admitting: Urology

## 2021-08-05 ENCOUNTER — Encounter: Payer: Self-pay | Admitting: Urology

## 2021-08-05 VITALS — BP 130/62 | HR 64 | Ht 68.0 in | Wt 165.0 lb

## 2021-08-05 DIAGNOSIS — N529 Male erectile dysfunction, unspecified: Secondary | ICD-10-CM

## 2021-08-05 DIAGNOSIS — R972 Elevated prostate specific antigen [PSA]: Secondary | ICD-10-CM

## 2021-08-05 DIAGNOSIS — N401 Enlarged prostate with lower urinary tract symptoms: Secondary | ICD-10-CM
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
## 2021-10-25 ENCOUNTER — Other Ambulatory Visit: Payer: Self-pay | Admitting: Urology

## 2021-12-06 DIAGNOSIS — B078 Other viral warts: Secondary | ICD-10-CM | POA: Diagnosis not present

## 2021-12-06 DIAGNOSIS — D485 Neoplasm of uncertain behavior of skin: Secondary | ICD-10-CM | POA: Diagnosis not present

## 2021-12-06 DIAGNOSIS — D225 Melanocytic nevi of trunk: Secondary | ICD-10-CM | POA: Diagnosis not present

## 2021-12-06 DIAGNOSIS — L570 Actinic keratosis: Secondary | ICD-10-CM | POA: Diagnosis not present

## 2021-12-06 DIAGNOSIS — Z85828 Personal history of other malignant neoplasm of skin: Secondary | ICD-10-CM | POA: Diagnosis not present

## 2021-12-06 DIAGNOSIS — D2272 Melanocytic nevi of left lower limb, including hip: Secondary | ICD-10-CM | POA: Diagnosis not present

## 2021-12-06 DIAGNOSIS — C44622 Squamous cell carcinoma of skin of right upper limb, including shoulder: Secondary | ICD-10-CM | POA: Diagnosis not present

## 2021-12-06 DIAGNOSIS — X32XXXA Exposure to sunlight, initial encounter: Secondary | ICD-10-CM | POA: Diagnosis not present

## 2021-12-06 DIAGNOSIS — D2261 Melanocytic nevi of right upper limb, including shoulder: Secondary | ICD-10-CM | POA: Diagnosis not present

## 2022-01-23 DIAGNOSIS — Z125 Encounter for screening for malignant neoplasm of prostate: Secondary | ICD-10-CM | POA: Diagnosis not present

## 2022-01-23 DIAGNOSIS — E785 Hyperlipidemia, unspecified: Secondary | ICD-10-CM | POA: Diagnosis not present

## 2022-01-23 DIAGNOSIS — R252 Cramp and spasm: Secondary | ICD-10-CM | POA: Diagnosis not present

## 2022-01-23 DIAGNOSIS — I10 Essential (primary) hypertension: Secondary | ICD-10-CM | POA: Diagnosis not present

## 2022-01-23 DIAGNOSIS — G4700 Insomnia, unspecified: Secondary | ICD-10-CM | POA: Diagnosis not present

## 2022-01-27 DIAGNOSIS — D0461 Carcinoma in situ of skin of right upper limb, including shoulder: Secondary | ICD-10-CM | POA: Diagnosis not present

## 2022-01-27 DIAGNOSIS — D0462 Carcinoma in situ of skin of left upper limb, including shoulder: Secondary | ICD-10-CM | POA: Diagnosis not present

## 2022-01-27 DIAGNOSIS — D485 Neoplasm of uncertain behavior of skin: Secondary | ICD-10-CM | POA: Diagnosis not present

## 2022-01-27 DIAGNOSIS — C44622 Squamous cell carcinoma of skin of right upper limb, including shoulder: Secondary | ICD-10-CM | POA: Diagnosis not present

## 2022-01-27 DIAGNOSIS — D2361 Other benign neoplasm of skin of right upper limb, including shoulder: Secondary | ICD-10-CM | POA: Diagnosis not present

## 2022-01-28 DIAGNOSIS — L309 Dermatitis, unspecified: Secondary | ICD-10-CM | POA: Diagnosis not present

## 2022-01-31 DIAGNOSIS — T1502XA Foreign body in cornea, left eye, initial encounter: Secondary | ICD-10-CM | POA: Diagnosis not present

## 2022-02-26 ENCOUNTER — Other Ambulatory Visit: Payer: Self-pay | Admitting: Urology

## 2022-02-26 DIAGNOSIS — N5201 Erectile dysfunction due to arterial insufficiency: Secondary | ICD-10-CM

## 2022-04-02 DIAGNOSIS — F419 Anxiety disorder, unspecified: Secondary | ICD-10-CM | POA: Diagnosis not present

## 2022-04-02 DIAGNOSIS — R42 Dizziness and giddiness: Secondary | ICD-10-CM | POA: Diagnosis not present

## 2022-04-02 DIAGNOSIS — I10 Essential (primary) hypertension: Secondary | ICD-10-CM | POA: Diagnosis not present

## 2022-04-02 DIAGNOSIS — R252 Cramp and spasm: Secondary | ICD-10-CM | POA: Diagnosis not present

## 2022-04-02 DIAGNOSIS — N529 Male erectile dysfunction, unspecified: Secondary | ICD-10-CM | POA: Diagnosis not present

## 2022-04-02 DIAGNOSIS — Z8679 Personal history of other diseases of the circulatory system: Secondary | ICD-10-CM | POA: Diagnosis not present

## 2022-04-08 DIAGNOSIS — D0462 Carcinoma in situ of skin of left upper limb, including shoulder: Secondary | ICD-10-CM | POA: Diagnosis not present

## 2022-04-09 DIAGNOSIS — H2512 Age-related nuclear cataract, left eye: Secondary | ICD-10-CM | POA: Diagnosis not present

## 2022-04-09 DIAGNOSIS — M3501 Sicca syndrome with keratoconjunctivitis: Secondary | ICD-10-CM | POA: Diagnosis not present

## 2022-04-09 DIAGNOSIS — Z0100 Encounter for examination of eyes and vision without abnormal findings: Secondary | ICD-10-CM | POA: Diagnosis not present

## 2022-04-09 DIAGNOSIS — H2513 Age-related nuclear cataract, bilateral: Secondary | ICD-10-CM | POA: Diagnosis not present

## 2022-04-09 DIAGNOSIS — H2511 Age-related nuclear cataract, right eye: Secondary | ICD-10-CM | POA: Diagnosis not present

## 2022-05-13 DIAGNOSIS — D2271 Melanocytic nevi of right lower limb, including hip: Secondary | ICD-10-CM | POA: Diagnosis not present

## 2022-05-13 DIAGNOSIS — D2262 Melanocytic nevi of left upper limb, including shoulder: Secondary | ICD-10-CM | POA: Diagnosis not present

## 2022-05-13 DIAGNOSIS — D2272 Melanocytic nevi of left lower limb, including hip: Secondary | ICD-10-CM | POA: Diagnosis not present

## 2022-05-13 DIAGNOSIS — L570 Actinic keratosis: Secondary | ICD-10-CM | POA: Diagnosis not present

## 2022-05-13 DIAGNOSIS — Z85828 Personal history of other malignant neoplasm of skin: Secondary | ICD-10-CM | POA: Diagnosis not present

## 2022-05-13 DIAGNOSIS — D225 Melanocytic nevi of trunk: Secondary | ICD-10-CM | POA: Diagnosis not present

## 2022-05-13 DIAGNOSIS — D2261 Melanocytic nevi of right upper limb, including shoulder: Secondary | ICD-10-CM | POA: Diagnosis not present

## 2022-05-13 DIAGNOSIS — L821 Other seborrheic keratosis: Secondary | ICD-10-CM | POA: Diagnosis not present

## 2022-05-13 DIAGNOSIS — Z08 Encounter for follow-up examination after completed treatment for malignant neoplasm: Secondary | ICD-10-CM | POA: Diagnosis not present

## 2022-06-05 ENCOUNTER — Other Ambulatory Visit: Payer: Self-pay | Admitting: Urology

## 2022-07-03 DIAGNOSIS — R42 Dizziness and giddiness: Secondary | ICD-10-CM | POA: Diagnosis not present

## 2022-07-03 DIAGNOSIS — R262 Difficulty in walking, not elsewhere classified: Secondary | ICD-10-CM | POA: Diagnosis not present

## 2022-07-07 DIAGNOSIS — R262 Difficulty in walking, not elsewhere classified: Secondary | ICD-10-CM | POA: Diagnosis not present

## 2022-07-07 DIAGNOSIS — R42 Dizziness and giddiness: Secondary | ICD-10-CM | POA: Diagnosis not present

## 2022-07-09 DIAGNOSIS — R42 Dizziness and giddiness: Secondary | ICD-10-CM | POA: Diagnosis not present

## 2022-07-09 DIAGNOSIS — R262 Difficulty in walking, not elsewhere classified: Secondary | ICD-10-CM | POA: Diagnosis not present

## 2022-07-22 DIAGNOSIS — Z125 Encounter for screening for malignant neoplasm of prostate: Secondary | ICD-10-CM | POA: Diagnosis not present

## 2022-07-22 DIAGNOSIS — E785 Hyperlipidemia, unspecified: Secondary | ICD-10-CM | POA: Diagnosis not present

## 2022-07-22 DIAGNOSIS — I10 Essential (primary) hypertension: Secondary | ICD-10-CM | POA: Diagnosis not present

## 2022-07-29 DIAGNOSIS — Z0000 Encounter for general adult medical examination without abnormal findings: Secondary | ICD-10-CM | POA: Diagnosis not present

## 2022-07-29 DIAGNOSIS — I10 Essential (primary) hypertension: Secondary | ICD-10-CM | POA: Diagnosis not present

## 2022-07-29 DIAGNOSIS — E7800 Pure hypercholesterolemia, unspecified: Secondary | ICD-10-CM | POA: Diagnosis not present

## 2022-07-29 DIAGNOSIS — Z1331 Encounter for screening for depression: Secondary | ICD-10-CM | POA: Diagnosis not present

## 2022-07-29 DIAGNOSIS — F419 Anxiety disorder, unspecified: Secondary | ICD-10-CM | POA: Diagnosis not present

## 2022-08-07 ENCOUNTER — Other Ambulatory Visit: Payer: Medicare HMO

## 2022-08-08 ENCOUNTER — Ambulatory Visit: Payer: Medicare HMO | Admitting: Urology

## 2022-08-11 ENCOUNTER — Other Ambulatory Visit: Payer: Medicare HMO

## 2022-08-13 ENCOUNTER — Ambulatory Visit: Payer: Medicare HMO | Admitting: Urology

## 2022-08-13 ENCOUNTER — Encounter: Payer: Self-pay | Admitting: Urology

## 2022-08-13 VITALS — BP 154/71 | HR 66 | Ht 68.0 in | Wt 165.0 lb

## 2022-08-13 DIAGNOSIS — N5201 Erectile dysfunction due to arterial insufficiency: Secondary | ICD-10-CM | POA: Diagnosis not present

## 2022-08-13 DIAGNOSIS — R972 Elevated prostate specific antigen [PSA]: Secondary | ICD-10-CM

## 2022-08-13 DIAGNOSIS — N401 Enlarged prostate with lower urinary tract symptoms: Secondary | ICD-10-CM
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
## 2022-08-18 DIAGNOSIS — L309 Dermatitis, unspecified: Secondary | ICD-10-CM | POA: Diagnosis not present

## 2022-08-18 DIAGNOSIS — L821 Other seborrheic keratosis: Secondary | ICD-10-CM | POA: Diagnosis not present

## 2022-09-05 DIAGNOSIS — M3501 Sicca syndrome with keratoconjunctivitis: Secondary | ICD-10-CM | POA: Diagnosis not present

## 2022-09-05 DIAGNOSIS — L718 Other rosacea: Secondary | ICD-10-CM | POA: Diagnosis not present

## 2022-10-08 DIAGNOSIS — M3501 Sicca syndrome with keratoconjunctivitis: Secondary | ICD-10-CM | POA: Diagnosis not present

## 2022-10-08 DIAGNOSIS — L718 Other rosacea: Secondary | ICD-10-CM | POA: Diagnosis not present

## 2022-10-20 DIAGNOSIS — J209 Acute bronchitis, unspecified: Secondary | ICD-10-CM | POA: Diagnosis not present

## 2022-10-20 DIAGNOSIS — J019 Acute sinusitis, unspecified: Secondary | ICD-10-CM | POA: Diagnosis not present

## 2022-10-20 DIAGNOSIS — B9689 Other specified bacterial agents as the cause of diseases classified elsewhere: Secondary | ICD-10-CM | POA: Diagnosis not present

## 2022-10-20 DIAGNOSIS — Z03818 Encounter for observation for suspected exposure to other biological agents ruled out: Secondary | ICD-10-CM | POA: Diagnosis not present

## 2022-10-20 DIAGNOSIS — U071 COVID-19: Secondary | ICD-10-CM | POA: Diagnosis not present

## 2023-01-13 DIAGNOSIS — Z85828 Personal history of other malignant neoplasm of skin: Secondary | ICD-10-CM | POA: Diagnosis not present

## 2023-01-13 DIAGNOSIS — D2262 Melanocytic nevi of left upper limb, including shoulder: Secondary | ICD-10-CM | POA: Diagnosis not present

## 2023-01-13 DIAGNOSIS — L570 Actinic keratosis: Secondary | ICD-10-CM | POA: Diagnosis not present

## 2023-01-13 DIAGNOSIS — D2261 Melanocytic nevi of right upper limb, including shoulder: Secondary | ICD-10-CM | POA: Diagnosis not present

## 2023-01-13 DIAGNOSIS — D485 Neoplasm of uncertain behavior of skin: Secondary | ICD-10-CM | POA: Diagnosis not present

## 2023-01-13 DIAGNOSIS — D225 Melanocytic nevi of trunk: Secondary | ICD-10-CM | POA: Diagnosis not present

## 2023-01-13 DIAGNOSIS — D0471 Carcinoma in situ of skin of right lower limb, including hip: Secondary | ICD-10-CM | POA: Diagnosis not present

## 2023-01-13 DIAGNOSIS — D2272 Melanocytic nevi of left lower limb, including hip: Secondary | ICD-10-CM | POA: Diagnosis not present

## 2023-01-29 DIAGNOSIS — F419 Anxiety disorder, unspecified: Secondary | ICD-10-CM | POA: Diagnosis not present

## 2023-01-29 DIAGNOSIS — I10 Essential (primary) hypertension: Secondary | ICD-10-CM | POA: Diagnosis not present

## 2023-01-29 DIAGNOSIS — E7800 Pure hypercholesterolemia, unspecified: Secondary | ICD-10-CM | POA: Diagnosis not present

## 2023-01-29 DIAGNOSIS — L989 Disorder of the skin and subcutaneous tissue, unspecified: Secondary | ICD-10-CM | POA: Diagnosis not present

## 2023-01-29 DIAGNOSIS — Z125 Encounter for screening for malignant neoplasm of prostate: Secondary | ICD-10-CM | POA: Diagnosis not present

## 2023-02-09 DIAGNOSIS — D0471 Carcinoma in situ of skin of right lower limb, including hip: Secondary | ICD-10-CM | POA: Diagnosis not present

## 2023-02-13 ENCOUNTER — Other Ambulatory Visit: Payer: Self-pay | Admitting: *Deleted

## 2023-02-13 MED ORDER — DUTASTERIDE 0.5 MG PO CAPS: ORAL_CAPSULE | ORAL | 3 refills | Status: DC

## 2023-08-13 ENCOUNTER — Ambulatory Visit: Payer: Medicare HMO | Admitting: Urology

## 2023-08-13 ENCOUNTER — Ambulatory Visit: Payer: Self-pay | Admitting: Urology

## 2023-08-13 ENCOUNTER — Encounter: Payer: Self-pay | Admitting: Urology

## 2023-08-13 VITALS — BP 130/80 | HR 74 | Ht 66.0 in | Wt 165.0 lb

## 2023-08-13 DIAGNOSIS — N401 Enlarged prostate with lower urinary tract symptoms: Secondary | ICD-10-CM | POA: Diagnosis not present

## 2023-08-13 DIAGNOSIS — R972 Elevated prostate specific antigen [PSA]: Secondary | ICD-10-CM

## 2023-08-13 DIAGNOSIS — N5201 Erectile dysfunction due to arterial insufficiency: Secondary | ICD-10-CM

## 2023-08-13 NOTE — Progress Notes (Signed)
 08/13/2023 8:19 AM   Michael [name]. [birth year] [id number]  Referring provider: Lyle San, MD [address] [facility] [place],  [place] [address]  Chief Complaint  Patient presents with   Elevated PSA    Urologic problem list:    1. Elevated PSA -Prostate biopsy 2000 for PSA of 4.6 with benign pathology -BPH with lower urinary tract symptoms; on dutasteride  3 times weekly -Erectile dysfunction; generic sildenafil   2. BPH with LUTS -Stable on Dutasteride   3. Erectile Dysfunction - Not sexually active   HPI: 78 y.o. male presents for annual follow-up.  Doing well since last years visit Stable LUTS which are not bothersome Remains on dutasteride  PSA 07/22/2023 stable at 2.26 (uncorrected) PDE 5 inhibitors no longer effective and he and his wife are not sexually active   Surgical History: Past Surgical History:  Procedure Laterality Date   AMPUTATION FINGER Left 1965   5TH DIGIT, INDUSTRIAL ACCIDENT   PROSTATE BIOPSY  03/1998   NEGATIVE    Home Medications:  Allergies as of 08/13/2023       Reactions   Penicillins    Sulfa Antibiotics    Sulfamethoxazole-trimethoprim         Medication List        Accurate as of August 13, 2023  8:19 AM. If you have any questions, ask your nurse or doctor.          aspirin EC 81 MG tablet Take by mouth.   Co Q-10 100 MG Caps Take 1 capsule by mouth daily.   dutasteride  0.5 MG capsule Commonly known as: AVODART  TAKE 1 CAPSULE ON MONDAY, WEDNESDAY AND FRIDAY   Efferdent Denture Cleanser Tbef   Fish Oil 1000 MG Caps Take 2,000 mg by mouth daily.   losartan -hydrochlorothiazide 50-12.5 MG tablet Commonly known as: HYZAAR Take 1 tablet by mouth daily.   Magnesium 400 MG Tabs Take by mouth.   omeprazole  20 MG capsule Commonly known as: PRILOSEC Take 1 capsule (20 mg total) by mouth daily.   rosuvastatin  20 MG tablet Commonly known as: Crestor  Take 1 tablet (20 mg total) by mouth  daily.   tadalafil 20 MG tablet Commonly known as: CIALIS Take by mouth.   traZODone  100 MG tablet Commonly known as: DESYREL  TAKE 2 TABLETS EVERY DAY        Allergies:  Allergies  Allergen Reactions   Penicillins    Sulfa Antibiotics    Sulfamethoxazole-Trimethoprim     Family History: Family History  Problem Relation Age of Onset   Emphysema Mother    Diabetes Maternal Uncle     Social History:  reports that he has never smoked. He has never used smokeless tobacco. He reports that he does not drink alcohol and does not use drugs.   Physical Exam: BP 130/80   Pulse 74   Ht 5' 6 (1.676 m)   Wt 165 lb (74.8 kg)   BMI 26.63 kg/m   Constitutional:  Alert and oriented, No acute distress. HEENT: [place] AT Respiratory: Normal respiratory effort, no increased work of breathing. GU: Prostate 60 g, smooth without nodule Psychiatric: Normal mood and affect.   Assessment & Plan:    1.  Elevated PSA Stable; benign DRE We discussed prostate cancer screening guidelines at last years visit and he desires to continue annual PSA/DRE until at least age 60 Follow-up 1 year  2.  BPH with LUTS Stable on dutasteride   3.  Erectile dysfunction PDE 5 inhibitors no longer  effective and he does not desire second line options     Geraline Knapp, MD  [facility] [address] [place], [place] [address] [phone]

## 2023-11-25 NOTE — H&P (View-Only) (Signed)
 Subjective:   CC: Non-recurrent unilateral inguinal hernia without obstruction or gangrene [K40.90]  HPI: returns for evaluation of above.   History of Present Illness Michael [name]. is a 78 year old male who presents for surgical consultation regarding an inguinal hernia. He was referred for evaluation of an inguinal hernia.  Approximately six to seven weeks ago, he experienced a coughing fit and felt a 'pop' in his groin area. Since then, he has noticed a reducible inguinal hernia that is increasing in size without associated pain.  He is concerned about the timing of surgical repair in relation to his ability to perform activities such as mowing and using a weed eater, preferring to have the hernia repaired before the mowing season begins.  He takes a daily aspirin, does not smoke, and is not on any GLP-1 medications.  UPDATE: No changes since above HPI. Ready to schedule now   Past Medical History:  has a past medical history of Allergy, History of atrial fibrillation, Hyperlipidemia, and Hypertension.  Past Surgical History:  Past Surgical History:  Procedure Laterality Date  . COLONOSCOPY  11/14/2016   Procedure: COLORECTAL CANCER SCREENING; COLONOSCOPY ON INDIVIDUAL AT HIGH RISK;  Surgeon: Charlean, Toribio Elbe, MD;  Location: [facility] ENDO/[facility];  Service: Gastroenterology;;    Family History: family history includes Alcohol abuse in his father; Cancer in his mother; High blood pressure (Hypertension) in his father.  Social History:  reports that he has never smoked. He has never used smokeless tobacco. He reports that he does not drink alcohol and does not use drugs.  Current Medications: has a current medication list which includes the following prescription(s): dutasteride , flurbiprofen , losartan -hydrochlorothiazide, magnesium oxide, omega-3-dha-epa-fish oil, omeprazole , rosuvastatin , tadalafil, and trazodone .  Allergies:  Allergies as of 11/25/2023 - Reviewed 11/25/2023   Allergen Reaction Noted  . Penicillin Unknown 11/14/2016  . Penicillins Other (See Comments) 08/09/2012  . Sulfa (sulfonamide antibiotics) Other (See Comments) 08/21/2014    ROS:  A 15 point review of systems was performed and pertinent positives and negatives noted in HPI   Objective:     BP 113/63   Pulse 59   Ht 170.2 cm (5' 7)   Wt 74.8 kg (165 lb)   BMI 25.84 kg/m   Constitutional :  Alert, cooperative, no distress  Lymphatics/Throat:  Supple, no lymphadenopathy  Respiratory:  clear to auscultation bilaterally  Cardiovascular:  regular rate and rhythm  Gastrointestinal: soft, non-tender; bowel sounds normal; no masses,  no organomegaly. inguinal hernia noted.  moderate, reducible, right  Musculoskeletal: Steady gait and movement  Skin: Cool and moist  Psychiatric: Normal affect, non-agitated, not confused       LABS:  N/a   RADS: N/a Assessment:       Non-recurrent unilateral inguinal hernia without obstruction or gangrene [K40.90], RIGHT  Plan:     1. Non-recurrent unilateral inguinal hernia without obstruction or gangrene [K40.90]   Discussed the risk of surgery including recurrence, which can be up to 50% in the case of incisional or complex hernias, possible use of prosthetic materials (mesh) and the increased risk of mesh infxn if used, bleeding, chronic pain, post-op infxn, post-op SBO or ileus, and possible re-operation to address said risks. The risks of general anesthetic, if used, includes MI, CVA, sudden death or even reaction to anesthetic medications also discussed. Alternatives include continued observation.  Benefits include possible symptom relief, prevention of incarceration, strangulation, enlargement in size over time, and the risk of emergency surgery in the face of strangulation.  Typical post-op recovery time of 3-5 days with 2 weeks of activity restrictions were also discussed.  ED return precautions given for sudden increase in pain,  size of hernia with accompanying fever, nausea, and/or vomiting.  The patient verbalized understanding and all questions were answered to the patient's satisfaction.   2. Patient has elected to proceed with surgical treatment. Robotic assisted laparoscopic, right [phone]  labs/images/medications/previous chart entries reviewed personally and relevant changes/updates noted above.

## 2023-12-11 ENCOUNTER — Other Ambulatory Visit: Payer: Self-pay

## 2023-12-11 ENCOUNTER — Encounter
Admission: RE | Admit: 2023-12-11 | Discharge: 2023-12-11 | Disposition: A | Discharge status: Home / Self Care | Source: Ambulatory Visit | Attending: Surgery | Admitting: Surgery

## 2023-12-11 VITALS — Ht 67.0 in | Wt 165.0 lb

## 2023-12-11 DIAGNOSIS — I10 Essential (primary) hypertension: Secondary | ICD-10-CM

## 2023-12-11 HISTORY — DX: Gastro-esophageal reflux disease without esophagitis: K21.9

## 2023-12-11 HISTORY — DX: Essential (primary) hypertension: I10

## 2023-12-11 NOTE — Patient Instructions (Addendum)
 Your procedure is scheduled on: Thursday 12/17/23 Report to the Registration Desk on the 1st floor of the [facility]. To find out your arrival time, please call [phone] between 1PM - 3PM on: Wednesday 12/16/23 If your arrival time is 6:00 am, do not arrive before that time as the [facility] entrance doors do not open until 6:00 am.  REMEMBER: Instructions that are not followed completely may result in serious medical risk, up to and including death; or upon the discretion of your surgeon and anesthesiologist your surgery may need to be rescheduled.  Do not eat food or drink any liquids after midnight the night before surgery.  No gum chewing or hard candies.  One week prior to surgery: Stop Anti-inflammatories (NSAIDS) such as Advil, Aleve, Ibuprofen, Motrin, Naproxen, Naprosyn and Aspirin based products such as Excedrin, Goody's Powder, BC Powder.  You may however, continue to take Tylenol if needed for pain up until the day of surgery.  Stop ANY OVER THE COUNTER supplements and vitamins until after surgery.  **Follow recommendations regarding stopping blood thinners.** Stop Aspirin Today 10/17/  Continue taking all of your other prescription medications up until the day of surgery.  ON THE DAY OF SURGERY ONLY TAKE THESE MEDICATIONS WITH SIPS OF WATER:  omeprazole  (PRILOSEC) 20 MG capsule   No Alcohol for 24 hours before or after surgery.  No Smoking including e-cigarettes for 24 hours before surgery.  No chewable tobacco products for at least 6 hours before surgery.  No nicotine patches on the day of surgery.  Do not use any recreational drugs for at least a week (preferably 2 weeks) before your surgery.  Please be advised that the combination of cocaine and anesthesia may have negative outcomes, up to and including death. If you test positive for cocaine, your surgery will be cancelled.  On the morning of surgery brush your teeth with toothpaste and water, you may  rinse your mouth with mouthwash if you wish. Do not swallow any toothpaste or mouthwash.  Use CHG Soap or wipes as directed on instruction sheet.  Do not shave body hair from the neck down 48 hours before surgery.  Do not wear lotions, powders, or perfumes on the day of surgery.  Wear comfortable clothing (specific to your surgery type) to the hospital.  Do not wear jewelry, make-up, hairpins, clips or nail polish.  For welded (permanent) jewelry: bracelets, anklets, waist bands, etc.  Please have this removed prior to surgery.  If it is not removed, there is a chance that hospital personnel will need to cut it off on the day of surgery.  Contact lenses, hearing aids and dentures may not be worn into surgery.  Do not bring valuables to the hospital. [facility] is not responsible for any missing/lost belongings or valuables.   Notify your doctor if there is any change in your medical condition (cold, fever, infection).  After surgery, you can help prevent lung complications by doing breathing exercises.  Take deep breaths and cough every 1-2 hours. Your doctor may order a device called an Incentive Spirometer to help you take deep breaths.  When coughing or sneezing, hold a pillow firmly against your incision with both hands. This is called "splinting." Doing this helps protect your incision. It also decreases belly discomfort.  If you are being discharged the day of surgery, you will not be allowed to drive home. You will need a responsible individual to drive you home and stay with you for 24 hours  after surgery.   Please call the Pre-admissions Testing Dept. at [phone] if you have any questions about these instructions.  Surgery Visitation Policy:  Patients having surgery or a procedure may have two visitors.  Children under the age of 64 must have an adult with them who is not the patient.   Merchandiser, retail to address health-related social needs:   [email or web address]                                                                                                             Preparing for Surgery with CHLORHEXIDINE GLUCONATE (CHG) Soap  Chlorhexidine Gluconate (CHG) Soap  o An antiseptic cleaner that kills germs and bonds with the skin to continue killing germs even after washing  o Used for showering the night before surgery and morning of surgery  Before surgery, you can play an important role by reducing the number of germs on your skin.  CHG (Chlorhexidine gluconate) soap is an antiseptic cleanser which kills germs and bonds with the skin to continue killing germs even after washing.  Please do not use if you have an allergy to CHG or antibacterial soaps. If your skin becomes reddened/irritated stop using the CHG.  1. Shower the NIGHT BEFORE SURGERY with CHG soap.  2. If you choose to wash your hair, wash your hair first as usual with your normal shampoo.  3. After shampooing, rinse your hair and body thoroughly to remove the shampoo.  4. Use CHG as you would any other liquid soap. You can apply CHG directly to the skin and wash gently with a clean washcloth.  5. Apply the CHG soap to your body only from the neck down. Do not use on open wounds or open sores. Avoid contact with your eyes, ears, mouth, and genitals (private parts). Wash face and genitals (private parts) with your normal soap.  6. Wash thoroughly, paying special attention to the area where your surgery will be performed.  7. Thoroughly rinse your body with warm water.  8. Do not shower/wash with your normal soap after using and rinsing off the CHG soap.  9. Do not use lotions, oils, etc., after showering with CHG.  10. Pat yourself dry with a clean towel.  11. Wear clean pajamas to bed the night before surgery.  12. Place clean sheets on your bed the night of your shower and do not sleep with pets.  13. Do not apply any  deodorants/lotions/powders.  14. Please wear clean clothes to the hospital.  15. Remember to brush your teeth with your regular toothpaste.

## 2023-12-15 ENCOUNTER — Encounter
Admission: RE | Admit: 2023-12-15 | Discharge: 2023-12-15 | Disposition: A | Discharge status: Home / Self Care | Source: Ambulatory Visit | Attending: Surgery | Admitting: Surgery

## 2023-12-15 ENCOUNTER — Encounter: Payer: Self-pay | Admitting: Urgent Care

## 2023-12-15 DIAGNOSIS — Z01818 Encounter for other preprocedural examination: Secondary | ICD-10-CM | POA: Insufficient documentation

## 2023-12-15 DIAGNOSIS — I10 Essential (primary) hypertension: Secondary | ICD-10-CM | POA: Insufficient documentation

## 2023-12-15 DIAGNOSIS — R001 Bradycardia, unspecified: Secondary | ICD-10-CM | POA: Diagnosis not present

## 2023-12-15 LAB — CBC
HCT: 47.6 % (ref 39.0–52.0)
Hemoglobin: 16.1 g/dL (ref 13.0–17.0)
MCH: 31.2 pg (ref 26.0–34.0)
MCHC: 33.8 g/dL (ref 30.0–36.0)
MCV: 92.2 fL (ref 80.0–100.0)
Platelets: 231 K/uL (ref 150–400)
RBC: 5.16 MIL/uL (ref 4.22–5.81)
RDW: 11.7 % (ref 11.5–15.5)
WBC: 6.6 K/uL (ref 4.0–10.5)
nRBC: 0 % (ref 0.0–0.2)

## 2023-12-15 LAB — BASIC METABOLIC PANEL WITH GFR
Anion gap: 10 (ref 5–15)
BUN: 21 mg/dL (ref 8–23)
CO2: 29 mmol/L (ref 22–32)
Calcium: 9.4 mg/dL (ref 8.9–10.3)
Chloride: 102 mmol/L (ref 98–111)
Creatinine, Ser: 1.08 mg/dL (ref 0.61–1.24)
GFR, Estimated: >60 mL/min (ref >60)
Glucose, Bld: 84 mg/dL (ref 70–99)
Potassium: 3.8 mmol/L (ref 3.5–5.1)
Sodium: 141 mmol/L (ref 135–145)

## 2023-12-17 ENCOUNTER — Ambulatory Visit
Admission: RE | Admit: 2023-12-17 | Discharge: 2023-12-17 | Disposition: A | Discharge status: Home / Self Care | Attending: Surgery | Admitting: Surgery

## 2023-12-17 ENCOUNTER — Ambulatory Visit: Admitting: Anesthesiology

## 2023-12-17 ENCOUNTER — Other Ambulatory Visit: Payer: Self-pay

## 2023-12-17 ENCOUNTER — Encounter
Admission: RE | Disposition: A | Discharge status: Home / Self Care | Payer: Self-pay | Source: Home / Self Care | Attending: Surgery

## 2023-12-17 ENCOUNTER — Encounter: Payer: Self-pay | Admitting: Surgery

## 2023-12-17 DIAGNOSIS — I10 Essential (primary) hypertension: Secondary | ICD-10-CM | POA: Insufficient documentation

## 2023-12-17 DIAGNOSIS — Z7982 Long term (current) use of aspirin: Secondary | ICD-10-CM | POA: Insufficient documentation

## 2023-12-17 DIAGNOSIS — K4090 Unilateral inguinal hernia, without obstruction or gangrene, not specified as recurrent: Secondary | ICD-10-CM | POA: Diagnosis present

## 2023-12-17 HISTORY — PX: XI ROBOTIC ASSISTED INGUINAL HERNIA REPAIR WITH MESH: SHX6706

## 2023-12-17 SURGERY — REPAIR, HERNIA, INGUINAL, ROBOT-ASSISTED, LAPAROSCOPIC, USING MESH
Anesthesia: General | Site: Abdomen | Laterality: Right

## 2023-12-17 MED ORDER — CHLORHEXIDINE GLUCONATE 0.12 % MT SOLN
15.0000 mL | Freq: Once | OROMUCOSAL | Status: AC
  Administered 2023-12-17: 15 mL via OROMUCOSAL

## 2023-12-17 MED ORDER — DOCUSATE SODIUM 100 MG PO CAPS
100.0000 mg | ORAL_CAPSULE | Freq: Two times a day (BID) | ORAL | 0 refills | Status: AC | PRN
  Filled 2023-12-17: qty 20, 10d supply, fill #0

## 2023-12-17 MED ORDER — ONDANSETRON HCL 4 MG/2ML IJ SOLN
INTRAMUSCULAR | Status: AC
Start: 2023-12-17 — End: 2023-12-17
  Filled 2023-12-17: qty 2

## 2023-12-17 MED ORDER — DEXAMETHASONE SOD PHOSPHATE PF 10 MG/ML IJ SOLN
INTRAMUSCULAR | Status: DC | PRN
  Administered 2023-12-17: 10 mg via INTRAVENOUS

## 2023-12-17 MED ORDER — HYDRALAZINE HCL 20 MG/ML IJ SOLN
10.0000 mg | Freq: Once | INTRAMUSCULAR | Status: AC
  Administered 2023-12-17: 10 mg via INTRAVENOUS

## 2023-12-17 MED ORDER — BUPIVACAINE-EPINEPHRINE (PF) 0.5% -1:200000 IJ SOLN
INTRAMUSCULAR | Status: AC
  Filled 2023-12-17: qty 30

## 2023-12-17 MED ORDER — FENTANYL CITRATE (PF) 100 MCG/2ML IJ SOLN
INTRAMUSCULAR | Status: AC
  Filled 2023-12-17: qty 2

## 2023-12-17 MED ORDER — ROCURONIUM BROMIDE 100 MG/10ML IV SOLN
INTRAVENOUS | Status: DC | PRN
  Administered 2023-12-17: 10 mg via INTRAVENOUS
  Administered 2023-12-17: 50 mg via INTRAVENOUS
  Administered 2023-12-17: 20 mg via INTRAVENOUS

## 2023-12-17 MED ORDER — LIDOCAINE HCL (CARDIAC) PF 100 MG/5ML IV SOSY
PREFILLED_SYRINGE | INTRAVENOUS | Status: DC | PRN
  Administered 2023-12-17: 80 mg via INTRAVENOUS

## 2023-12-17 MED ORDER — PROPOFOL 10 MG/ML IV BOLUS
INTRAVENOUS | Status: DC | PRN
  Administered 2023-12-17: 140 mg via INTRAVENOUS
  Administered 2023-12-17: 60 mg via INTRAVENOUS

## 2023-12-17 MED ORDER — CHLORHEXIDINE GLUCONATE 0.12 % MT SOLN
OROMUCOSAL | Status: AC
  Filled 2023-12-17: qty 15

## 2023-12-17 MED ORDER — ACETAMINOPHEN 10 MG/ML IV SOLN
INTRAVENOUS | Status: AC
  Filled 2023-12-17: qty 100

## 2023-12-17 MED ORDER — ACETAMINOPHEN 325 MG PO TABS
650.0000 mg | ORAL_TABLET | Freq: Three times a day (TID) | ORAL | 0 refills | Status: AC | PRN
  Filled 2023-12-17: qty 40, 7d supply, fill #0

## 2023-12-17 MED ORDER — TRAMADOL HCL 50 MG PO TABS
50.0000 mg | ORAL_TABLET | Freq: Three times a day (TID) | ORAL | 0 refills | Status: AC | PRN
  Filled 2023-12-17: qty 6, 2d supply, fill #0

## 2023-12-17 MED ORDER — DEXMEDETOMIDINE HCL IN NACL 80 MCG/20ML IV SOLN
INTRAVENOUS | Status: DC | PRN
  Administered 2023-12-17 (×3): 8 ug via INTRAVENOUS

## 2023-12-17 MED ORDER — LIDOCAINE HCL (PF) 2 % IJ SOLN
INTRAMUSCULAR | Status: AC
  Filled 2023-12-17: qty 5

## 2023-12-17 MED ORDER — BUPIVACAINE-EPINEPHRINE 0.5% -1:200000 IJ SOLN
INTRAMUSCULAR | Status: DC | PRN
Start: 2023-12-17 — End: 2023-12-17
  Administered 2023-12-17: 30 mL

## 2023-12-17 MED ORDER — FENTANYL CITRATE (PF) 100 MCG/2ML IJ SOLN
INTRAMUSCULAR | Status: DC | PRN
  Administered 2023-12-17 (×2): 50 ug via INTRAVENOUS

## 2023-12-17 MED ORDER — PROPOFOL 10 MG/ML IV BOLUS
INTRAVENOUS | Status: AC
Start: 2023-12-17 — End: 2023-12-17
  Filled 2023-12-17: qty 20

## 2023-12-17 MED ORDER — ROCURONIUM BROMIDE 10 MG/ML (PF) SYRINGE
PREFILLED_SYRINGE | INTRAVENOUS | Status: AC
  Filled 2023-12-17: qty 10

## 2023-12-17 MED ORDER — CEFAZOLIN SODIUM-DEXTROSE 2-3 GM-%(50ML) IV SOLR
INTRAVENOUS | Status: DC | PRN
  Administered 2023-12-17: 2 g via INTRAVENOUS

## 2023-12-17 MED ORDER — DROPERIDOL 2.5 MG/ML IJ SOLN
0.6250 mg | Freq: Once | INTRAMUSCULAR | Status: AC
  Administered 2023-12-17: 0.625 mg via INTRAVENOUS

## 2023-12-17 MED ORDER — ACETAMINOPHEN 10 MG/ML IV SOLN
INTRAVENOUS | Status: DC | PRN
  Administered 2023-12-17: 1000 mg via INTRAVENOUS

## 2023-12-17 MED ORDER — FENTANYL CITRATE (PF) 100 MCG/2ML IJ SOLN: 25.0000 ug | INTRAMUSCULAR | Status: DC | PRN

## 2023-12-17 MED ORDER — LACTATED RINGERS IV SOLN: INTRAVENOUS | Status: DC

## 2023-12-17 MED ORDER — HYDRALAZINE HCL 20 MG/ML IJ SOLN
INTRAMUSCULAR | Status: AC
  Filled 2023-12-17: qty 1

## 2023-12-17 MED ORDER — OXYCODONE HCL 5 MG PO TABS
5.0000 mg | ORAL_TABLET | Freq: Once | ORAL | Status: AC | PRN
  Administered 2023-12-17: 5 mg via ORAL

## 2023-12-17 MED ORDER — ONDANSETRON HCL 4 MG/2ML IJ SOLN
INTRAMUSCULAR | Status: DC | PRN
Start: 2023-12-17 — End: 2023-12-17
  Administered 2023-12-17: 4 mg via INTRAVENOUS

## 2023-12-17 MED ORDER — ORAL CARE MOUTH RINSE: 15.0000 mL | Freq: Once | OROMUCOSAL | Status: AC

## 2023-12-17 MED ORDER — OXYCODONE HCL 5 MG PO TABS
ORAL_TABLET | ORAL | Status: AC
  Filled 2023-12-17: qty 1

## 2023-12-17 MED ORDER — DROPERIDOL 2.5 MG/ML IJ SOLN
INTRAMUSCULAR | Status: AC
Start: 2023-12-17 — End: 2023-12-17
  Filled 2023-12-17: qty 2

## 2023-12-17 MED ORDER — OXYCODONE HCL 5 MG/5ML PO SOLN: 5.0000 mg | Freq: Once | ORAL | Status: AC | PRN

## 2023-12-17 MED ORDER — SUGAMMADEX SODIUM 200 MG/2ML IV SOLN
INTRAVENOUS | Status: DC | PRN
  Administered 2023-12-17: 200 mg via INTRAVENOUS

## 2023-12-17 SURGICAL SUPPLY — 35 items
BAG PRESSURE INF REUSE 1000 (BAG) IMPLANT
BNDG GAUZE DERMACEA FLUFF 4 (GAUZE/BANDAGES/DRESSINGS) ×1 IMPLANT
COVER TIP SHEARS 8 DVNC (MISCELLANEOUS) ×1 IMPLANT
COVER WAND RF STERILE (DRAPES) ×1 IMPLANT
DEFOGGER SCOPE WARM SEASHARP (MISCELLANEOUS) ×1 IMPLANT
DERMABOND ADVANCED .7 DNX12 (GAUZE/BANDAGES/DRESSINGS) ×1 IMPLANT
DRAPE ARM DVNC X/XI (DISPOSABLE) ×3 IMPLANT
DRAPE COLUMN DVNC XI (DISPOSABLE) ×1 IMPLANT
ELECTRODE REM PT RTRN 9FT ADLT (ELECTROSURGICAL) ×1 IMPLANT
FORCEPS BPLR FENES DVNC XI (FORCEP) ×1 IMPLANT
FORCEPS PROGRASP DVNC XI (FORCEP) IMPLANT
GLOVE BIOGEL PI IND STRL 7.0 (GLOVE) ×2 IMPLANT
GLOVE SURG SYN 6.5 PF PI (GLOVE) ×4 IMPLANT
GOWN STRL REUS W/ TWL LRG LVL3 (GOWN DISPOSABLE) ×4 IMPLANT
IRRIGATOR SUCT 8 DISP DVNC XI (IRRIGATION / IRRIGATOR) IMPLANT
IV 0.9% NACL 1000 ML (IV SOLUTION) IMPLANT
MANIFOLD NEPTUNE II (INSTRUMENTS) ×1 IMPLANT
MESH 3DMAX MID 4X6 RT LRG (Mesh General) IMPLANT
NDL DRIVE SUT CUT DVNC (INSTRUMENTS) ×1 IMPLANT
NDL HYPO 22X1.5 SAFETY MO (MISCELLANEOUS) ×1 IMPLANT
NDL INSUFFLATION 14GA 120MM (NEEDLE) ×1 IMPLANT
NEEDLE DRIVE SUT CUT DVNC (INSTRUMENTS) ×1 IMPLANT
NEEDLE HYPO 22X1.5 SAFETY MO (MISCELLANEOUS) ×1 IMPLANT
NEEDLE INSUFFLATION 14GA 120MM (NEEDLE) ×1 IMPLANT
OBTURATOR OPTICALSTD 8 DVNC (TROCAR) ×1 IMPLANT
PACK LAP CHOLECYSTECTOMY (MISCELLANEOUS) ×1 IMPLANT
SCISSORS MNPLR CVD DVNC XI (INSTRUMENTS) ×1 IMPLANT
SEAL UNIV 5-12 XI (MISCELLANEOUS) ×3 IMPLANT
SET TUBE SMOKE EVAC HIGH FLOW (TUBING) ×1 IMPLANT
SOLUTION ELECTROSURG ANTI STCK (MISCELLANEOUS) ×1 IMPLANT
SUT STRATA 3-0 15 RB-1.5 (SUTURE) ×1 IMPLANT
SUT VIC AB 2-0 SH 27XBRD (SUTURE) ×1 IMPLANT
SUTURE MNCRL 4-0 27XMF (SUTURE) ×1 IMPLANT
SYR 30ML LL (SYRINGE) ×1 IMPLANT
TAPE TRANSPORE STRL 2 31045 (GAUZE/BANDAGES/DRESSINGS) ×1 IMPLANT

## 2023-12-17 NOTE — Op Note (Signed)
 Preoperative diagnosis: Right, initial reducible inguinal Hernia.  Postoperative diagnosis: Same  Procedure: Robotic assisted laparoscopic right inguinal hernia repair with mesh  Anesthesia: General  Surgeon: Dr. Tye  Wound Classification: Clean  Specimen: none  Complications: None  Estimated Blood Loss: 10mL   Indications:  inguinal hernia. Repair was indicated to avoid complications of incarceration, obstruction and pain, and a prosthetic mesh repair was elected.  See H&P for further details.  Findings: 1. Vas Deferens and cord structures identified and preserved 2. Bard 3D max medium weight mesh used for repair 3. Adequate hemostasis achieved  Description of procedure: The patient was taken to the operating room. A time-out was completed verifying correct patient, procedure, site, positioning, and implant(s) and/or special equipment prior to beginning this procedure.  Area was prepped and draped in the usual sterile fashion. An incision was marked 20 cm above the pubic tubercle, slightly above the umbilicus  Scrotum wrapped in Kerlix roll.  Veress needle inserted at palmer's point.  Saline drop test noted to be positive with gradual increase in pressure after initiation of gas insufflation.  15 mm of pressure was achieved prior to removing the Veress needle and then placing a 8 mm port via the Optiview technique through the supraumbilical site.  Inspection of the area afterwards noted no injury to the surrounding organs during insertion of the needle and the port.  2 port sites were marked 8 cm to the lateral sides of the initial port, and a 8 mm robotic port was placed on the left side, another 8 mm robotic port on the right side under direct supervision.  Local anesthesia  infused to the preplanned incision sites prior to insertion of the port.  The [employer] platform was then brought into the operative field and docked to the ports.  Examination of the abdominal cavity noted  a right inguinal hernia.  A peritoneal flap was created approximately 8cm cephalad to the defect by using scissors with electrocautery.  Dissection was carried down towards the pubic tubercle, developing the myopectineal orifice view.  Laterally the flap was carried towards the ASIS.  Large hernia sac was noted, which carefully dissected away from the adjacent tissues to be fully reduced out of hernia cavity.  Any bleeding was controlled with combination of electrocautery and manual pressure.    After confirming adequate dissection and the peritoneal reflection completely down and away from the cord structures, a Large Bard 3DMax medium weight mesh was placed within the anterior abdominal wall, secured in place using 2-0 Vicryl on an SH needle immediately above the pubic tubercle.  After noting proper placement of the mesh with the peritoneal reflection deep to it, the previously created peritoneal flap was secured back up to the anterior abdominal wall using running 3-0 V-Lock.  1 additional 3 oh V-Loc used to close the moderate-sized flap defect created while dissecting the sac out of the inguinal canal.  All needles were then removed out of the abdominal cavity, Xi platform undocked from the ports and removed off of operative field.  Marcaine infused as ilioinguinal block.  Abdomen then desufflated and ports removed. All the skin incisions were then closed with a subcuticular stitch of Monocryl 4-0. Dermabond was applied. The testis was gently pulled down into its anatomic position in the scrotum.  The patient tolerated the procedure well and was taken to the postanesthesia care unit in stable condition. Sponge and instrument count correct at end of procedure.

## 2023-12-17 NOTE — Anesthesia Preprocedure Evaluation (Addendum)
 Anesthesia Evaluation  Patient identified by MRN, date of birth, ID band Patient awake    Reviewed: Allergy & Precautions, NPO status , Patient's Chart, lab work & pertinent test results  History of Anesthesia Complications Negative for: history of anesthetic complications  Airway Mallampati: III  TM Distance: >3 FB Neck ROM: full    Dental  (+) Upper Dentures, Lower Dentures   Pulmonary neg pulmonary ROS   Pulmonary exam normal        Cardiovascular hypertension, On Medications Normal cardiovascular exam     Neuro/Psych negative neurological ROS  negative psych ROS   GI/Hepatic Neg liver ROS,GERD  Medicated,,  Endo/Other  negative endocrine ROS    Renal/GU      Musculoskeletal   Abdominal   Peds  Hematology negative hematology ROS (+)   Anesthesia Other Findings Past Medical History: No date: GERD (gastroesophageal reflux disease) No date: Hypertension  Past Surgical History: 1965: AMPUTATION FINGER; Left     Comment:  5TH DIGIT, INDUSTRIAL ACCIDENT 03/1998: PROSTATE BIOPSY     Comment:  NEGATIVE     Reproductive/Obstetrics negative OB ROS                              Anesthesia Physical Anesthesia Plan  ASA: 2  Anesthesia Plan: General ETT   Post-op Pain Management: Ofirmev IV (intra-op)*, Toradol IV (intra-op)* and Dilaudid IV   Induction: Intravenous  PONV Risk Score and Plan: Ondansetron, Dexamethasone, Midazolam and Treatment may vary due to age or medical condition  Airway Management Planned: Oral ETT  Additional Equipment:   Intra-op Plan:   Post-operative Plan: Extubation in OR  Informed Consent: I have reviewed the patients History and Physical, chart, labs and discussed the procedure including the risks, benefits and alternatives for the proposed anesthesia with the patient or authorized representative who has indicated his/her understanding and  acceptance.     Dental Advisory Given  Plan Discussed with: Anesthesiologist, CRNA and Surgeon  Anesthesia Plan Comments: (Patient consented for risks of anesthesia including but not limited to:  - adverse reactions to medications - damage to eyes, teeth, lips or other oral mucosa - nerve damage due to positioning  - sore throat or hoarseness - Damage to heart, brain, nerves, lungs, other parts of body or loss of life  Patient voiced understanding and assent.)         Anesthesia Quick Evaluation

## 2023-12-17 NOTE — Interval H&P Note (Signed)
 No change. OK to proceed.

## 2023-12-17 NOTE — Anesthesia Postprocedure Evaluation (Signed)
 Anesthesia Post Note  Patient: Michael [name].  Procedure(s) Performed: REPAIR, HERNIA, INGUINAL, ROBOT-ASSISTED, LAPAROSCOPIC, USING MESH (Right: Abdomen)  Patient location during evaluation: PACU Anesthesia Type: General Level of consciousness: awake and alert Pain management: pain level controlled Vital Signs Assessment: post-procedure vital signs reviewed and stable Respiratory status: spontaneous breathing, nonlabored ventilation, respiratory function stable and patient connected to nasal cannula oxygen Cardiovascular status: blood pressure returned to baseline and stable Postop Assessment: no apparent nausea or vomiting Anesthetic complications: no   No notable events documented.   Last Vitals:  Vitals:   12/17/23 1702 12/17/23 1722  BP: (!) 154/70 (!) 167/69  Pulse: 72 70  Resp: 18   Temp: (!) 36.1 C   SpO2: 99% 100%    Last Pain:  Vitals:   12/17/23 1702  TempSrc: Tympanic  PainSc: 4                  Debby Mines

## 2023-12-17 NOTE — Transfer of Care (Signed)
 Immediate Anesthesia Transfer of Care Note  Patient: Michael [name].  Procedure(s) Performed: REPAIR, HERNIA, INGUINAL, ROBOT-ASSISTED, LAPAROSCOPIC, USING MESH (Right: Abdomen)  Patient Location: PACU  Anesthesia Type:General  Level of Consciousness: drowsy  Airway & Oxygen Therapy: Patient Spontanous Breathing and Patient connected to face mask oxygen  Post-op Assessment: Report given to RN and Post -op Vital signs reviewed and stable  Post vital signs: Reviewed and stable  Last Vitals:  Vitals Value Taken Time  BP 150/67 12/17/23 15:10  Temp    Pulse 80 12/17/23 15:13  Resp 13 12/17/23 15:13  SpO2 100 % 12/17/23 15:13  Vitals shown include unfiled device data.  Last Pain:  Vitals:   12/17/23 1152  TempSrc: Temporal  PainSc: 0-No pain         Complications: No notable events documented.

## 2023-12-17 NOTE — Anesthesia Procedure Notes (Signed)
 Procedure Name: Intubation Date/Time: 12/17/2023 1:36 PM  Performed by: Lorrene Camelia LABOR, CRNAPre-anesthesia Checklist: Patient identified, Patient being monitored, Emergency Drugs available and Suction available Patient Re-evaluated:Patient Re-evaluated prior to induction Oxygen Delivery Method: Circle system utilized Preoxygenation: Pre-oxygenation with 100% oxygen Induction Type: IV induction Ventilation: Mask ventilation without difficulty Laryngoscope Size: Mac and 3 Grade View: Grade I Tube type: Oral Tube size: 7.5 mm Number of attempts: 1 Airway Equipment and Method: Stylet Placement Confirmation: ETT inserted through vocal cords under direct vision, positive ETCO2 and breath sounds checked- equal and bilateral Secured at: 23 cm Tube secured with: Tape Dental Injury: Teeth and Oropharynx as per pre-operative assessment

## 2023-12-17 NOTE — Discharge Instructions (Addendum)
 Hernia repair, Care After This sheet gives you information about how to care for yourself after your procedure. Your health care provider may also give you more specific instructions. If you have problems or questions, contact your health care provider. What can I expect after the procedure? After your procedure, it is common to have the following: Pain in your abdomen, especially in the incision areas. You will be given medicine to control the pain. Tiredness. This is a normal part of the recovery process. Your energy level will return to normal over the next several weeks. Changes in your bowel movements, such as constipation or needing to go more often. Talk with your health care provider about how to manage this. Follow these instructions at home: Medicines  tylenol as needed for discomfort.   Use narcotics, if prescribed, only when tylenol is not enough to control pain.  325-650mg  every 8hrs to max of 3000mg /24hrs (including the 325mg  in every norco dose) for the tylenol.   Resume aspirin in 48 hours PLEASE RECORD NUMBER OF PILLS TAKEN UNTIL NEXT FOLLOW UP APPT.  THIS WILL HELP DETERMINE HOW READY YOU ARE TO BE RELEASED FROM ANY ACTIVITY RESTRICTIONS Do not drive or use heavy machinery while taking prescription pain medicine. Do not drink alcohol while taking prescription pain medicine.  Incision care    Follow instructions from your health care provider about how to take care of your incision areas. Make sure you: Keep your incisions clean and dry. Wash your hands with soap and water before and after applying medicine to the areas, and before and after changing your bandage (dressing). If soap and water are not available, use hand sanitizer. Change your dressing as told by your health care provider. Leave stitches (sutures), skin glue, or adhesive strips in place. These skin closures may need to stay in place for 2 weeks or longer. If adhesive strip edges start to loosen and curl up,  you may trim the loose edges. Do not remove adhesive strips completely unless your health care provider tells you to do that. Do not wear tight clothing over the incisions. Tight clothing may rub and irritate the incision areas, which may cause the incisions to open. Do not take baths, swim, or use a hot tub until your health care provider approves. OK TO SHOWER IN 24HRS.   Check your incision area every day for signs of infection. Check for: More redness, swelling, or pain. More fluid or blood. Warmth. Pus or a bad smell. Activity Avoid lifting anything that is heavier than 10 lb (4.5 kg) for 2 weeks or until your health care provider says it is okay. No pushing/pulling greater than 30lbs You may resume normal activities as told by your health care provider. Ask your health care provider what activities are safe for you. Take rest breaks during the day as needed. Eating and drinking Follow instructions from your health care provider about what you can eat after surgery. To prevent or treat constipation while you are taking prescription pain medicine, your health care provider may recommend that you: Drink enough fluid to keep your urine clear or pale yellow. Take over-the-counter or prescription medicines. Eat foods that are high in fiber, such as fresh fruits and vegetables, whole grains, and beans. Limit foods that are high in fat and processed sugars, such as fried and sweet foods. General instructions Ask your health care provider when you will need an appointment to get your sutures or staples removed. Keep all follow-up visits as told  by your health care provider. This is important. Contact a health care provider if: You have more redness, swelling, or pain around your incisions. You have more fluid or blood coming from the incisions. Your incisions feel warm to the touch. You have pus or a bad smell coming from your incisions or your dressing. You have a fever. You have an  incision that breaks open (edges not staying together) after sutures or staples have been removed. You develop a rash. You have chest pain or difficulty breathing. You have pain or swelling in your legs. You feel light-headed or you faint. Your abdomen swells (becomes distended). You have nausea or vomiting. You have blood in your stool (feces). This information is not intended to replace advice given to you by your health care provider. Make sure you discuss any questions you have with your health care provider. Document Released: 08/30/2004 Document Revised: 10/30/2017 Document Reviewed: 11/12/2015 [employer]vier Interactive Patient Education  2019 [employer].

## 2023-12-18 ENCOUNTER — Encounter: Payer: Self-pay | Admitting: Surgery

## 2024-01-05 ENCOUNTER — Other Ambulatory Visit: Payer: Self-pay | Admitting: *Deleted

## 2024-01-05 ENCOUNTER — Telehealth: Payer: Self-pay | Admitting: Urology

## 2024-01-05 NOTE — Telephone Encounter (Signed)
 Patient dropped in office today to request refill for dutasteride . He said he did not contact his pharmacy first, he just came in here. Pharmacy is [facility].

## 2024-01-05 NOTE — Telephone Encounter (Signed)
 Patient to call back with address and phone number

## 2024-01-06 MED ORDER — DUTASTERIDE 0.5 MG PO CAPS: ORAL_CAPSULE | ORAL | 3 refills | Status: AC

## 2024-01-06 NOTE — Addendum Note (Signed)
 Addended by: GENITA HARLENE CROME on: 01/06/2024 08:24 AM   Modules accepted: Orders

## 2024-01-15 ENCOUNTER — Other Ambulatory Visit (HOSPITAL_COMMUNITY): Payer: Self-pay

## 2024-08-10 ENCOUNTER — Ambulatory Visit: Admitting: Urology
